# Patient Record
Sex: Male | Born: 1935 | Race: White | Hispanic: No | Marital: Married | Smoking: Former smoker
Health system: Southern US, Community
[De-identification: ages and names within clinical notes are randomized; demographics above are authoritative.]

## PROBLEM LIST (undated history)

## (undated) DIAGNOSIS — I509 Heart failure, unspecified: Secondary | ICD-10-CM

## (undated) DIAGNOSIS — C801 Malignant (primary) neoplasm, unspecified: Secondary | ICD-10-CM

## (undated) DIAGNOSIS — C787 Secondary malignant neoplasm of liver and intrahepatic bile duct: Secondary | ICD-10-CM

## (undated) HISTORY — PX: PERMANENT PACEMAKER INSERTION: SHX6023

## (undated) HISTORY — DX: Heart failure, unspecified: I50.9

## (undated) HISTORY — PX: VASECTOMY: SHX75

---

## 1959-12-31 HISTORY — PX: URETHROPLASTY: SHX499

## 2003-12-31 HISTORY — PX: COLON SURGERY: SHX602

## 2007-04-06 ENCOUNTER — Encounter: Admission: RE | Admit: 2007-04-06 | Discharge: 2007-04-06 | Payer: Self-pay | Admitting: Internal Medicine

## 2007-07-29 ENCOUNTER — Inpatient Hospital Stay (HOSPITAL_COMMUNITY): Admission: EM | Admit: 2007-07-29 | Discharge: 2007-07-30 | Payer: Self-pay | Admitting: Emergency Medicine

## 2007-08-02 ENCOUNTER — Emergency Department (HOSPITAL_COMMUNITY): Admission: EM | Admit: 2007-08-02 | Discharge: 2007-08-02 | Payer: Self-pay | Admitting: Emergency Medicine

## 2007-08-06 ENCOUNTER — Emergency Department (HOSPITAL_COMMUNITY): Admission: EM | Admit: 2007-08-06 | Discharge: 2007-08-06 | Payer: Self-pay | Admitting: Emergency Medicine

## 2007-12-08 ENCOUNTER — Ambulatory Visit (HOSPITAL_COMMUNITY): Admission: RE | Admit: 2007-12-08 | Discharge: 2007-12-08 | Payer: Self-pay | Admitting: Urology

## 2008-02-26 ENCOUNTER — Ambulatory Visit: Payer: Self-pay | Admitting: Vascular Surgery

## 2008-12-20 ENCOUNTER — Ambulatory Visit: Payer: Self-pay | Admitting: Vascular Surgery

## 2009-01-05 ENCOUNTER — Ambulatory Visit: Payer: Self-pay | Admitting: Vascular Surgery

## 2009-01-06 ENCOUNTER — Ambulatory Visit: Payer: Self-pay | Admitting: Vascular Surgery

## 2010-01-26 ENCOUNTER — Ambulatory Visit: Payer: Self-pay | Admitting: Vascular Surgery

## 2010-03-04 ENCOUNTER — Inpatient Hospital Stay: Payer: Self-pay | Admitting: Specialist

## 2010-03-26 ENCOUNTER — Ambulatory Visit (HOSPITAL_COMMUNITY): Admission: RE | Admit: 2010-03-26 | Discharge: 2010-03-26 | Payer: Self-pay | Admitting: Internal Medicine

## 2010-12-30 HISTORY — PX: SCALP LACERATION REPAIR: SHX6089

## 2011-01-29 ENCOUNTER — Ambulatory Visit: Admit: 2011-01-29 | Payer: Self-pay | Admitting: Vascular Surgery

## 2011-05-14 NOTE — Procedures (Signed)
DUPLEX ULTRASOUND OF ABDOMINAL AORTA   INDICATION:  Followup abdominal aortic aneurysm.   HISTORY:  Diabetes:  No.  Cardiac:  Arrhythmia and pacemaker in May of 2003.  Hypertension:  Yes.  Smoking:  Quit.  Connective Tissue Disorder:  Family History:  Yes.  Previous Surgery:   DUPLEX EXAM:         AP (cm)                   TRANSVERSE (cm)  Proximal             3.24 cm                   3.56 cm  Mid                  4.91 cm                   4.75 cm  Distal               4.14 cm                   4.43 cm  Right Iliac          1.53 cm                   1.32 cm  Left Iliac           1.47 cm                   1.36 cm   PREVIOUS:  Date:  AP:  4.52  TRANSVERSE:  4.83   IMPRESSION:  Abdominal aortic aneurysm noted with the largest  measurement of 4.91 cm x 4.75 cm.   ___________________________________________  Larina Earthly, M.D.   MG/MEDQ  D:  01/05/2009  T:  01/05/2009  Job:  [id number]

## 2011-05-14 NOTE — Procedures (Signed)
CAROTID DUPLEX EXAM   INDICATION:  Left carotid bruit.   HISTORY:  Diabetes:  No.  Cardiac:  Pacemaker.  Hypertension:  Yes.  Smoking:  Quit.  Previous Surgery:  None.  CV History:  Amaurosis Fugax No, Paresthesias No, Hemiparesis No.                                       RIGHT             LEFT  Brachial systolic pressure:  Brachial Doppler waveforms:  Vertebral direction of flow:        Antegrade         Antegrade  DUPLEX VELOCITIES (cm/sec)  CCA peak systolic                   87                58  ECA peak systolic                   126               81  ICA peak systolic                   186               Occluded  ICA end diastolic                   72                Occluded  PLAQUE MORPHOLOGY:                  Heterogenous      Heterogenous  PLAQUE AMOUNT:                      Moderate          Severe  PLAQUE LOCATION:                    ICA, ECA          ICA, ECA   IMPRESSION:  1. Occluded left internal carotid artery.  2. 40-59% stenosis noted in the right internal carotid artery.  3. Antegrade bilateral vertebral arteries.   ___________________________________________  Larina Earthly, M.D.   MG/MEDQ  D:  12/20/2008  T:  12/20/2008  Job:  [id number]

## 2011-05-14 NOTE — Assessment & Plan Note (Signed)
OFFICE VISIT   David Miles, David Miles  DOB:  01-12-1934                                       01/26/2010  [id number]#:[id number]   The patient presents today for continued follow-up of his infrarenal  abdominal aortic aneurysm.  He is here for 1-year follow-up.  He was  scheduled to have an ultrasound today, but had a recent CT scan for  evaluation of renal stone at [facility].  I do have his CT scan  for evaluation and have reviewed this and discussed it with the patient.  He has no symptoms referable to his aneurysm.  He does have multiple  difficulties mainly related to urethral stricture.  He had urethral  stricture replacement 40 years ago and has had to self-cath occasionally  and is having ongoing evaluation for this.  He continues to be a  nonsmoker.  He has no cardiac difficulties aside from arrhythmia with  pacemaker placed in 2003.   REVIEW OF SYSTEMS:  Is negative for pulmonary or GI difficulty.  He  denies any new neurologic deficits.   PHYSICAL EXAM:  Well-developed, well-nourished white male appearing  stated age of 45, he is in no acute distress.  Blood pressure is 146/81,  pulse 69, respirations 18, his temperature is 97.9.  HEENT is normal.  Chest is clear bilaterally.  Abdominal exam is soft, nontender.  No  masses.  Does have a lower midline incision.  He has 2+ femoral,  popliteal and posterior tibial pulses.  He has no ulcers or rashes on  his lower extremities and no focal deficits and no focal weakness from  neurologic standpoint.   I reviewed his CAT scan and this shows maximum diameter of 5 cm which is  up slightly from his ultrasound 1 year ago.  I have repeated that we see  him again in 1 year with repeat ultrasound at that time.  I discussed  symptoms of leaking aneurysm and he knows to report immediately should  this occur, otherwise we will see him again in 1 year.     Larina Earthly, M.D.  Electronically Signed   TFE/MEDQ  D:  01/26/2010  T:  01/29/2010  Job:  [id number]   cc:   Kari Baars, M.D.
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
## 2011-05-14 NOTE — Procedures (Signed)
DUPLEX ULTRASOUND OF ABDOMINAL AORTA   INDICATION:  Follow up abdominal aortic aneurysm.   HISTORY:  Diabetes:  No.  Cardiac:  Arrhythmia, defibrillator, pacemaker implant May 2003.  Hypertension:  Yes.  Smoking:  Quit.  Connective Tissue Disorder:  Family History:  AAA father and uncle, aunt died of AAA rupture.  Previous Surgery:  No.   DUPLEX EXAM:         AP (cm)                   TRANSVERSE (cm)  Proximal             2.88 cm                   2.95 cm  Mid                  4.52 cm                   4.83 cm  Distal               3.27 cm                   3.20 cm  Right Iliac          1.09 cm  Left Iliac           1.25 cm   PREVIOUS CT :  Date: 07/29/2007  AP:  4.9  TRANSVERSE:  4.5  Previous ultrasound:  08/11/2006  AP:  3.8  TRANSVERSE:  4.4   IMPRESSION:  Bilobular abdominal aortic aneurysm in mid/distal aorta  shows an increase from previous ultrasound study and correlates with  previous CT.   ___________________________________________  Larina Earthly, M.D.   AS/MEDQ  D:  02/26/2008  T:  02/27/2008  Job:  [id number]

## 2011-05-14 NOTE — Op Note (Signed)
NAMEKORBIN, MAPPS NO.:  [id number]   MEDICAL RECORD NO.:  [id number]          PATIENT TYPE:  AMB   LOCATION:  DAY                          FACILITY:  [facility]   PHYSICIAN:  Martina Sinner, MD DATE OF BIRTH:  17-Apr-1934   DATE OF PROCEDURE:  12/08/2007  DATE OF DISCHARGE:                               OPERATIVE REPORT   PREOPERATIVE DIAGNOSIS:  Panurethral stricture disease.   POSTOPERATIVE DIAGNOSIS:  Panurethral stricture disease.   OPERATION PERFORMED:  1. Cystoscopy.  2. Balloon dilation of urethral stricture.   SURGEON:  Leighton Roach McDiarmid, M.D.   ANESTHESIA:   INDICATIONS:  The patient has full length urethral stricture disease and  performs self-catheterization.  He is having to catheterize  approximately every three days for slowing of his stream.   DESCRIPTION OF THE OPERATION:  The patient was prepped and draped in the  usual fashion.  We used the fluoroscopy table, but it was not very  satisfactory for seeing the urethra.  I know this patient very well.  I  decided to pass a Sensor wire using open-ended ureteral catheter  technique into the bladder instead of doing a retrograde urethrogram. I  initially tried to cystoscope him with a 36 [place] scope, but could only  enter approximately 2 cm at which point he had approximately a 10 or 12  French strictured estimated appearance.   Once the Sensor wire was in good position I passed the [place] balloon  dilation catheter and used its markers to estimate where the external  sphincter was.  I balloon dilated for five minutes and removed the  dilation catheter after emptying the balloon.  I then recystoscoped with  a 17 [place] scope.  There was excellent dilation that was  circumferentially even throughout the urethra.  He has full length  panurethral stricture disease starting 1 cm distal to the urethral  sphincter.  The prostatic urethra demonstrated bilobar enlargement of  the prostate, a Grade  1-2/4 bladder trabeculation, but no bladder  stones.  There was no obvious perforation. There was little-to-no  bleeding.   It surprised me that I could not place an 18 French red rubber [place]  catheter over the Sensor wire after removing the scope since his urethra  was so rigid, is firmly fixed, and it would no accept an 18.  I was able  to pass a 16 [place] tip catheter and instilled approximately 7  mL in the balloon.   My plan is to leave the catheter in for three days just to make certain  he does not get extravasation and a soft tissue infection.  The patient  will remove the catheter on Friday and then start catheterizing himself  again next week as per protocol.           ______________________________  Martina Sinner, MD  Electronically Signed     SAM/MEDQ  D:  12/08/2007  T:  12/09/2007  Job:  [id number]
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
## 2011-05-14 NOTE — Assessment & Plan Note (Signed)
OFFICE VISIT   David, Miles  DOB:  02/18/1934                                       01/06/2009  EAVWU#:[id number]   The patient presents today for follow-up of his known abdominal aortic  aneurysm and also for discussion of a new finding of left internal  carotid artery occlusion.  He has no prior symptoms of stroke, amaurosis  fugax or transient ischemic attack.  He was found to have a bruit by Dr.  Eric Form and was sent to our office for noninvasive carotid duplex  evaluation.  This revealed occlusion of his left internal carotid artery  and moderate 40-59% stenosis of his right internal carotid artery.  He  also has a known abdominal aortic aneurysm and we have been following  this with serial ultrasounds.  He has no new changes in his medical  issues, he does have hypertension, does have history of congestive  failure, did have an internal pacer and defibrillator placed 6 years  ago.  He does not have a history of premature atherosclerotic disease in  his family.   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  He does not smoke, having quit 7 years ago and has a glass of wine  daily.   REVIEW OF SYSTEMS:  Weight is 235 pounds, 6 feet 1 inch tall, he does  have a history of atrial fibrillation, urethral stricture which he does  self-cath, skin for psoriasis; otherwise, pulmonary, GI negative.   He has no known medication allergies and his medication list is  attached.   PHYSICAL EXAMINATION:  General:  A well-developed well-nourished white  male appearing his stated age of 69.  Vital Signs:  His blood pressure  is 175/99, pulse 68, respirations 18.  I do not appreciate carotid  bruits.  He has 2+ carotid pulses bilaterally.  His radial, femoral,  popliteal and posterior tibial pulses are 2+ bilaterally.  Abdominal  Exam:  Reveals prominent aortic pulsation without pain.   He underwent ultrasound yesterday revealing  no significant change, his  maximal diameter was 4.9 cm of his left renal aneurysm, the previous  finding was 4.8 cm in February 2009.  I did review his carotid duplex  with him as well which shows an occluded internal carotid artery.  He  does have lower end of the moderate stenosis in the right carotid  system.  He his left-handed.  I discussed this at length with Mr.  Miles and his wife present.  I explained that I would not recommend  any further evaluation with his asymptomatic occlusion.  I explained as  long as he does have adequate flow in the right carotid that this should  be a non-issue.  We will continue to follow him on a yearly basis with  carotid duplex and ultrasound of his aneurysm.  He will notify us should  he develop any symptoms of aneurysm disease, which we have discussed  with him, and also if he should develop any neurologic deficits.   Larina Earthly, M.D.  Electronically Signed   TFE/MEDQ  D:  01/06/2009  T:  01/09/2009  Job:  [id number]   cc:   Kari Baars, M.D.
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
## 2011-08-03 ENCOUNTER — Emergency Department (HOSPITAL_COMMUNITY)
Admission: EM | Admit: 2011-08-03 | Discharge: 2011-08-03 | Disposition: A | Discharge status: Other Acute Inpatient Hospital | Payer: Medicare Other | Attending: Emergency Medicine | Admitting: Emergency Medicine

## 2011-08-03 ENCOUNTER — Emergency Department (HOSPITAL_COMMUNITY): Payer: Medicare Other

## 2011-08-03 DIAGNOSIS — R42 Dizziness and giddiness: Secondary | ICD-10-CM | POA: Insufficient documentation

## 2011-08-03 DIAGNOSIS — Z95810 Presence of automatic (implantable) cardiac defibrillator: Secondary | ICD-10-CM | POA: Insufficient documentation

## 2011-08-03 DIAGNOSIS — Z79899 Other long term (current) drug therapy: Secondary | ICD-10-CM | POA: Insufficient documentation

## 2011-08-03 DIAGNOSIS — I4729 Other ventricular tachycardia: Secondary | ICD-10-CM | POA: Insufficient documentation

## 2011-08-03 DIAGNOSIS — I10 Essential (primary) hypertension: Secondary | ICD-10-CM | POA: Insufficient documentation

## 2011-08-03 DIAGNOSIS — I509 Heart failure, unspecified: Secondary | ICD-10-CM | POA: Insufficient documentation

## 2011-08-03 DIAGNOSIS — Y831 Surgical operation with implant of artificial internal device as the cause of abnormal reaction of the patient, or of later complication, without mention of misadventure at the time of the procedure: Secondary | ICD-10-CM | POA: Insufficient documentation

## 2011-08-03 DIAGNOSIS — I472 Ventricular tachycardia, unspecified: Secondary | ICD-10-CM | POA: Insufficient documentation

## 2011-08-03 DIAGNOSIS — T82897A Other specified complication of cardiac prosthetic devices, implants and grafts, initial encounter: Secondary | ICD-10-CM | POA: Insufficient documentation

## 2011-08-03 LAB — CBC
MCH: 29.8 pg (ref 26.0–34.0)
MCHC: 34.1 g/dL (ref 30.0–36.0)
MCV: 87.3 fL (ref 78.0–100.0)
Platelets: 152 10*3/uL (ref 150–400)

## 2011-08-03 LAB — DIFFERENTIAL
Basophils Relative: 0 % (ref 0–1)
Eosinophils Absolute: 0.1 10*3/uL (ref 0.0–0.7)
Lymphs Abs: 2.1 10*3/uL (ref 0.7–4.0)
Monocytes Absolute: 0.6 10*3/uL (ref 0.1–1.0)
Monocytes Relative: 8 % (ref 3–12)

## 2011-08-03 LAB — POCT I-STAT, CHEM 8
BUN: 30 mg/dL — ABNORMAL HIGH (ref 6–23)
Calcium, Ion: 1.17 mmol/L (ref 1.12–1.32)
Chloride: 108 mEq/L (ref 96–112)
Creatinine, Ser: 1.7 mg/dL — ABNORMAL HIGH (ref 0.50–1.35)
Glucose, Bld: 97 mg/dL (ref 70–99)
TCO2: 20 mmol/L (ref 0–100)

## 2011-08-03 LAB — MAGNESIUM: Magnesium: 2.2 mg/dL (ref 1.5–2.5)

## 2011-09-30 HISTORY — PX: ABDOMINAL AORTIC ANEURYSM REPAIR: SUR1152

## 2011-10-07 LAB — BASIC METABOLIC PANEL
BUN: 14
Chloride: 107
Creatinine, Ser: 1.28
GFR calc non Af Amer: 55 — ABNORMAL LOW
Glucose, Bld: 95
Potassium: 4.6

## 2011-10-14 LAB — CBC
HCT: 36.9 — ABNORMAL LOW
MCHC: 33.8
MCV: 89.3
RBC: 4.13 — ABNORMAL LOW
WBC: 10.5

## 2011-10-14 LAB — PROTIME-INR
INR: 1.1
Prothrombin Time: 14.3

## 2011-10-14 LAB — COMPREHENSIVE METABOLIC PANEL
CO2: 26
Calcium: 9
Creatinine, Ser: 1.23
GFR calc non Af Amer: 58 — ABNORMAL LOW
Glucose, Bld: 131 — ABNORMAL HIGH
Sodium: 140
Total Protein: 6.3

## 2011-10-14 LAB — DIFFERENTIAL
Basophils Absolute: 0
Eosinophils Relative: 1
Lymphocytes Relative: 10 — ABNORMAL LOW
Lymphs Abs: 1.1
Neutro Abs: 8.6 — ABNORMAL HIGH
Neutrophils Relative %: 82 — ABNORMAL HIGH

## 2011-10-14 LAB — APTT: aPTT: 29

## 2011-10-14 LAB — HEMOGLOBIN: Hemoglobin: 10.9 — ABNORMAL LOW

## 2012-12-16 ENCOUNTER — Encounter: Payer: Self-pay | Admitting: Vascular Surgery

## 2013-02-11 ENCOUNTER — Ambulatory Visit: Payer: Medicare Other | Admitting: Neurosurgery

## 2014-01-03 ENCOUNTER — Ambulatory Visit (INDEPENDENT_AMBULATORY_CARE_PROVIDER_SITE_OTHER): Payer: Medicare Other | Admitting: Emergency Medicine

## 2014-01-03 ENCOUNTER — Ambulatory Visit: Payer: Medicare Other

## 2014-01-03 VITALS — BP 190/94 | HR 62 | Temp 98.0°F | Resp 16 | Ht 71.5 in | Wt 229.8 lb

## 2014-01-03 DIAGNOSIS — W19XXXA Unspecified fall, initial encounter: Secondary | ICD-10-CM

## 2014-01-03 DIAGNOSIS — R0781 Pleurodynia: Secondary | ICD-10-CM

## 2014-01-03 DIAGNOSIS — N3090 Cystitis, unspecified without hematuria: Secondary | ICD-10-CM

## 2014-01-03 LAB — POCT URINALYSIS DIPSTICK
Bilirubin, UA: NEGATIVE
Glucose, UA: NEGATIVE
Ketones, UA: NEGATIVE
NITRITE UA: POSITIVE
PH UA: 7
Protein, UA: NEGATIVE
SPEC GRAV UA: 1.02
UROBILINOGEN UA: 0.2

## 2014-01-03 LAB — POCT UA - MICROSCOPIC ONLY
CASTS, UR, LPF, POC: NEGATIVE
Crystals, Ur, HPF, POC: NEGATIVE
MUCUS UA: POSITIVE
YEAST UA: NEGATIVE

## 2014-01-03 MED ORDER — ACETAMINOPHEN-CODEINE #3 300-30 MG PO TABS: 1.0000 | ORAL_TABLET | ORAL | Status: DC | PRN

## 2014-01-03 NOTE — Progress Notes (Signed)
[facility] [address], [place] [address] [phone]  Date:  01/03/2014   Name:  David Miles   DOB:  02/01/1934   MRN:  [id number]  PCP:  No primary provider on file.    Chief Complaint: Fall   History of Present Illness:  David Miles is a 80 y.o. very pleasant male patient who presents with the following:  [place] three weeks ago when he became dizzy and fell backwards and injured his right side on the toilet lid.  He has experienced pleuritic pain since.  No hemoptysis or shortness of breath.  No nausea or vomiting.  No hematuria.  No abdominal pain. No improvement with over the counter medications or other home remedies. Denies other complaint or health concern today.   There are no active problems to display for this patient.   Past Medical History  Diagnosis Date  . CHF (congestive heart failure)     Past Surgical History  Procedure Laterality Date  . Colon surgery      History  Substance Use Topics  . Smoking status: Never Smoker   . Smokeless tobacco: Not on file  . Alcohol Use: Yes    No family history on file.  No Known Allergies  Medication list has been reviewed and updated.  No current outpatient prescriptions on file prior to visit.   No current facility-administered medications on file prior to visit.    Review of Systems:  As per HPI, otherwise negative.    Physical Examination: Filed Vitals:   01/03/14 1120  BP: 190/94  Pulse: 62  Temp: 98 F (36.7 C)  Resp: 16   Filed Vitals:   01/03/14 1120  Height: 5' 11.5" (1.816 m)  Weight: 229 lb 12.8 oz (104.237 kg)   Body mass index is 31.61 kg/(m^2). Ideal Body Weight: Weight in (lb) to have BMI = 25: 181.4  GEN: WDWN, NAD, Non-toxic, A & O x 3 HEENT: Atraumatic, Normocephalic. Neck supple. No masses, No LAD. Ears and Nose: No external deformity. CV: RRR, No M/G/R. No JVD. No thrill. No extra heart sounds. PULM: CTA B, no wheezes, crackles,  rhonchi. No retractions. No resp. distress. No accessory muscle use. ABD: S, NT, ND, +BS. No rebound. No HSM. EXTR: No c/c/e NEURO Normal gait.  PSYCH: Normally interactive. Conversant. Not depressed or anxious appearing.  Calm demeanor.    Assessment and Plan: Contusion chest wall Cystitis Urethral stricture self cath's Take cipro Tylenol #3   Signed,  Ellison Carwin, MD   [facility] reading (PRIMARY) by  Dr. Ouida Sills.  Negative chest Results for orders placed in visit on 01/03/14  POCT UA - MICROSCOPIC ONLY      Result Value Range   WBC, Ur, HPF, POC TNTC     RBC, urine, microscopic 1-3     Bacteria, U Microscopic 3+     Mucus, UA positive     Epithelial cells, urine per micros 3-4     Crystals, Ur, HPF, POC neg     Casts, Ur, LPF, POC neg     Yeast, UA neg    POCT URINALYSIS DIPSTICK      Result Value Range   Color, UA yellow     Clarity, UA turbid     Glucose, UA neg     Bilirubin, UA neg     Ketones, UA neg     Spec Grav, UA 1.020     Blood, UA trace-intact  pH, UA 7.0     Protein, UA neg     Urobilinogen, UA 0.2     Nitrite, UA positive     Leukocytes, UA large (3+)      .

## 2014-01-03 NOTE — Patient Instructions (Signed)
Chest Contusion °A chest contusion is a deep bruise on your chest area. Contusions are the result of an injury that caused bleeding under the skin. A chest contusion may involve bruising of the skin, muscles, or ribs. The contusion may turn blue, purple, or yellow. Minor injuries will give you a painless contusion, but more severe contusions may stay painful and swollen for a few weeks. °CAUSES  °A contusion is usually caused by a blow, trauma, or direct force to an area of the body. °SYMPTOMS  °· Swelling and redness of the injured area. °· Discoloration of the injured area. °· Tenderness and soreness of the injured area. °· Pain. °DIAGNOSIS  °The diagnosis can be made by taking a history and performing a physical exam. An X-ray, CT scan, or MRI may be needed to determine if there were any associated injuries, such as broken bones (fractures) or internal injuries. °TREATMENT  °Often, the best treatment for a chest contusion is resting, icing, and applying cold compresses to the injured area. Deep breathing exercises may be recommended to reduce the risk of pneumonia. Over-the-counter medicines may also be recommended for pain control. °HOME CARE INSTRUCTIONS  °· Put ice on the injured area. °· Put ice in a plastic bag. °· Place a towel between your skin and the bag. °· Leave the ice on for 15-20 minutes, 03-04 times a day. °· Only take over-the-counter or prescription medicines as directed by your caregiver. Your caregiver may recommend avoiding anti-inflammatory medicines (aspirin, ibuprofen, and naproxen) for 48 hours because these medicines may increase bruising. °· Rest the injured area. °· Perform deep-breathing exercises as directed by your caregiver. °· Stop smoking if you smoke. °· Do not lift objects over 5 pounds (2.3 kg) for 3 days or longer if recommended by your caregiver. °SEEK IMMEDIATE MEDICAL CARE IF:  °· You have increased bruising or swelling. °· You have pain that is getting worse. °· You have  difficulty breathing. °· You have dizziness, weakness, or fainting. °· You have blood in your urine or stool. °· You cough up or vomit blood. °· Your swelling or pain is not relieved with medicines. °MAKE SURE YOU:  °· Understand these instructions. °· Will watch your condition. °· Will get help right away if you are not doing well or get worse. °Document Released: 09/10/2001 Document Revised: 09/09/2012 Document Reviewed: 06/08/2012 °ExitCare® Patient Information ©2014 [employer]. ° °

## 2014-01-17 ENCOUNTER — Telehealth: Payer: Self-pay | Admitting: Hematology and Oncology

## 2014-01-17 ENCOUNTER — Telehealth: Payer: Self-pay | Admitting: Oncology

## 2014-01-17 NOTE — Telephone Encounter (Signed)
lvom for pt to return call in re to referral.  °

## 2014-01-17 NOTE — Telephone Encounter (Signed)
S/w pt and gve np appt 01/27 @ 1:30 w/Dr. Alen Blew Referring Dr. Matilde Sprang Dx- Back pain [place] unknown source Welcome packet mailed.

## 2014-01-17 NOTE — Telephone Encounter (Signed)
C/D 01/17/14 for appt. 01/25/14

## 2014-01-24 ENCOUNTER — Ambulatory Visit (HOSPITAL_BASED_OUTPATIENT_CLINIC_OR_DEPARTMENT_OTHER): Payer: Medicare Other

## 2014-01-24 ENCOUNTER — Encounter: Payer: Self-pay | Admitting: Oncology

## 2014-01-24 ENCOUNTER — Ambulatory Visit (HOSPITAL_BASED_OUTPATIENT_CLINIC_OR_DEPARTMENT_OTHER): Payer: Medicare Other | Admitting: Oncology

## 2014-01-24 ENCOUNTER — Other Ambulatory Visit (HOSPITAL_BASED_OUTPATIENT_CLINIC_OR_DEPARTMENT_OTHER): Payer: Medicare Other

## 2014-01-24 ENCOUNTER — Telehealth: Payer: Self-pay | Admitting: Oncology

## 2014-01-24 ENCOUNTER — Other Ambulatory Visit: Payer: Self-pay | Admitting: Oncology

## 2014-01-24 ENCOUNTER — Ambulatory Visit: Payer: Medicare Other

## 2014-01-24 VITALS — BP 172/77 | HR 71 | Temp 98.0°F | Resp 20 | Ht 71.5 in | Wt 225.1 lb

## 2014-01-24 DIAGNOSIS — M949 Disorder of cartilage, unspecified: Secondary | ICD-10-CM

## 2014-01-24 DIAGNOSIS — C229 Malignant neoplasm of liver, not specified as primary or secondary: Secondary | ICD-10-CM

## 2014-01-24 DIAGNOSIS — C7952 Secondary malignant neoplasm of bone marrow: Secondary | ICD-10-CM

## 2014-01-24 DIAGNOSIS — K7689 Other specified diseases of liver: Secondary | ICD-10-CM

## 2014-01-24 DIAGNOSIS — C801 Malignant (primary) neoplasm, unspecified: Secondary | ICD-10-CM

## 2014-01-24 DIAGNOSIS — C419 Malignant neoplasm of bone and articular cartilage, unspecified: Secondary | ICD-10-CM

## 2014-01-24 DIAGNOSIS — C7951 Secondary malignant neoplasm of bone: Secondary | ICD-10-CM

## 2014-01-24 DIAGNOSIS — M899 Disorder of bone, unspecified: Secondary | ICD-10-CM

## 2014-01-24 DIAGNOSIS — F17200 Nicotine dependence, unspecified, uncomplicated: Secondary | ICD-10-CM

## 2014-01-24 LAB — COMPREHENSIVE METABOLIC PANEL (CC13)
ALT: 18 U/L (ref 0–55)
ANION GAP: 9 meq/L (ref 3–11)
AST: 20 U/L (ref 5–34)
Albumin: 3.1 g/dL — ABNORMAL LOW (ref 3.5–5.0)
Alkaline Phosphatase: 105 U/L (ref 40–150)
BUN: 45.4 mg/dL — ABNORMAL HIGH (ref 7.0–26.0)
CALCIUM: 13.7 mg/dL — AB (ref 8.4–10.4)
CHLORIDE: 103 meq/L (ref 98–109)
CO2: 29 meq/L (ref 22–29)
Creatinine: 2.5 mg/dL — ABNORMAL HIGH (ref 0.7–1.3)
Glucose: 135 mg/dl (ref 70–140)
Potassium: 4.7 mEq/L (ref 3.5–5.1)
Sodium: 141 mEq/L (ref 136–145)
Total Bilirubin: 0.5 mg/dL (ref 0.20–1.20)
Total Protein: 7.5 g/dL (ref 6.4–8.3)

## 2014-01-24 LAB — CBC WITH DIFFERENTIAL/PLATELET
BASO%: 0.3 % (ref 0.0–2.0)
Basophils Absolute: 0 10*3/uL (ref 0.0–0.1)
EOS%: 1.3 % (ref 0.0–7.0)
Eosinophils Absolute: 0.2 10*3/uL (ref 0.0–0.5)
HCT: 32.1 % — ABNORMAL LOW (ref 38.4–49.9)
HGB: 10.6 g/dL — ABNORMAL LOW (ref 13.0–17.1)
LYMPH#: 1.3 10*3/uL (ref 0.9–3.3)
LYMPH%: 11.4 % — ABNORMAL LOW (ref 14.0–49.0)
MCH: 28.1 pg (ref 27.2–33.4)
MCHC: 33 g/dL (ref 32.0–36.0)
MCV: 85.1 fL (ref 79.3–98.0)
MONO#: 0.9 10*3/uL (ref 0.1–0.9)
MONO%: 8.2 % (ref 0.0–14.0)
NEUT#: 9 10*3/uL — ABNORMAL HIGH (ref 1.5–6.5)
NEUT%: 78.8 % — ABNORMAL HIGH (ref 39.0–75.0)
Platelets: 226 10*3/uL (ref 140–400)
RBC: 3.77 10*6/uL — AB (ref 4.20–5.82)
RDW: 13.8 % (ref 11.0–14.6)
WBC: 11.5 10*3/uL — ABNORMAL HIGH (ref 4.0–10.3)

## 2014-01-24 MED ORDER — ZOLEDRONIC ACID 4 MG/100ML IV SOLN
4.0000 mg | Freq: Once | INTRAVENOUS | Status: AC
  Administered 2014-01-24: 4 mg via INTRAVENOUS
  Filled 2014-01-24: qty 100

## 2014-01-24 MED ORDER — SODIUM CHLORIDE 0.9 % IV SOLN
Freq: Once | INTRAVENOUS | Status: AC
  Administered 2014-01-24: 16:00:00 via INTRAVENOUS

## 2014-01-24 MED ORDER — OXYCODONE HCL 5 MG PO TABS: 5.0000 mg | ORAL_TABLET | ORAL | Status: DC | PRN

## 2014-01-24 NOTE — Telephone Encounter (Signed)
gv and printed appt sched and avs for pt for Feb  °

## 2014-01-24 NOTE — Progress Notes (Signed)
Note dictated

## 2014-01-24 NOTE — Patient Instructions (Signed)

## 2014-01-24 NOTE — Progress Notes (Signed)
Checked in new patient with no financial issues. He has appt card. °

## 2014-01-24 NOTE — Telephone Encounter (Signed)
S/w pt and gve new d/t for appt 01/26 @ 1:30 w/Dr. Alen Blew.  Patient confirm appt.

## 2014-01-25 ENCOUNTER — Other Ambulatory Visit: Payer: Self-pay | Admitting: Oncology

## 2014-01-25 ENCOUNTER — Ambulatory Visit: Payer: Medicare Other | Admitting: Oncology

## 2014-01-25 ENCOUNTER — Other Ambulatory Visit: Payer: Self-pay | Admitting: Radiology

## 2014-01-25 ENCOUNTER — Other Ambulatory Visit: Payer: Medicare Other

## 2014-01-25 ENCOUNTER — Ambulatory Visit: Payer: Medicare Other

## 2014-01-25 DIAGNOSIS — C229 Malignant neoplasm of liver, not specified as primary or secondary: Secondary | ICD-10-CM

## 2014-01-25 DIAGNOSIS — C419 Malignant neoplasm of bone and articular cartilage, unspecified: Secondary | ICD-10-CM

## 2014-01-25 NOTE — Progress Notes (Signed)
CC:   [name], M.D. Reece Packer, MD  REASON FOR CONSULTATION:  Liver and bone lesions.  HISTORY OF PRESENT ILLNESS:  David Miles is a pleasant 80 year old gentleman, native of [place], currently resides in [place]. He is a gentleman with past medical history significant for coronary artery disease and atrial fibrillation, has a pacemaker in place.  He also has history of urethral strictures as well as nephrolithiasis.  For the last two months, he has noticed a flank pain and back pain after he had a fall episode in the bathroom.  He was evaluated by Dr. Matilde Sprang, and underwent a CT scan of the abdomen and pelvis which was designed for kidney stone protocol and showed a lytic lesion in the T12 vertebral body that is highly suspicious for metastatic disease.  The lytic lesion measuring about 3.2 cm.  There was also a noted multiple low-density lesions in the liver, right hepatic lobe measuring 2.6, and the largest lesion in the left hepatic lobe measuring 3.7.  Based on these findings, the patient was referred to me for an evaluation.  The patient had reported in the last two months, episodes of weight loss.  He also had episodes of confusion as well as deterioration in his cognitive and mentation.  He also had noticed decrease in his p.o. intake.  He had noted also some episodes of hematochezia, but has not been rather persistent.  He does report back pain and his back pain is reported as sharp in the middle of his back, not radiating and is not associated with any focal neurological problems.  He does report overall unsteady gait, but does not report any motor or sensory deficits.  REVIEW OF SYSTEMS:  He did not report any headaches, blurry vision, double vision.  Does report confusions, but no reporting any seizure activity.  Does report some mental status changes and lethargy at times. He does report back pain which he takes hydrocodone for, but that  seems to help some but not dramatically.  He does not report any other musculoskeletal pain such as hip pain or shoulder pain or arm pain.  He does not report any chest pain, shortness of breath.  Does report some occasional exertional dyspnea and difficulty breathing.  He does not report any palpitations.  He does not report any leg edema.  He does not report any orthopnea.  He does not report any cough.  He does not report any hemoptysis.  He does not report any wheezing.  He does not report any nausea, vomiting, abdominal pain, constipation, or diarrhea.  He does report some occasional hematochezia, but no melena.  He does report difficulty with urination as to self-catheterize.  He does not report any dysuria.  He does not report any hematuria.  He does not report any frequency, urgency, or hesitancy.  He does not report any bleeding or clotting tendencies.  He does not report any lymphadenopathy or petechiae.  He does not report any heat or cold intolerance.  The rest of the review of system was unremarkable.  PAST MEDICAL HISTORY:  Significant for atrial fibrillation, history of coronary artery disease, history of hypertension, history of ureteral stricture, history of pacemaker placement, partial colectomy for diverticulitis, history of abdominal aortic aneurysm.  MEDICATIONS:  Aspirin, B complex, Cipro, lisinopril, metoprolol, niacin, omega-3, and Tylenol as well as hydrocodone.  ALLERGIES:  None.  FAMILY HISTORY:  Not significant for any specific malignancy.  He is a former smoker, but quit a while  ago.  He is married.  He has two children.  He has had multiple occupations.  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  He also served in [employer] for four years.  PHYSICAL EXAMINATION:  General:  Alert, awake gentleman, appeared in some mild distress.  Vital Signs:  His blood pressure is 172/77, pulse 71, respirations 20, weighs 255 pounds.  He is down  four pounds since 01/03/2014.  His ECOG performance status is 1.  HEENT:  Head is normocephalic, atraumatic.  Pupils equal, round, and reactive to light. Oral mucosa moist and pink.  Neck:  Supple.  No lymphadenopathy.  Heart: Regular rate and rhythm.  S1, S2.  Lungs:  Clear to auscultation.  No rhonchi or wheeze, dullness to percussion.  Abdomen:  Soft, nontender. No hepatosplenomegaly.  Extremities:  No edema.  Neurologic: Did not have any deficits with motor and sensory and gait or deep tendon reflexes.  LABORATORY DATA:  Reviewed today, showed a hemoglobin of 10.6, white cell count of 11.5, platelet count of 226.  Chemistry showed a creatinine of 2.5, calcium of 13.  Normal liver function tests.  ASSESSMENT AND PLAN:  This is a new 80 year old gentleman with the following issues:  1. Liver lesion, suspicious for metastatic cancer as well as     involvement of T12 lesion.  The differential diagnosis was     discussed today with Mr. Shope and his family, and looks very     suspicious for advanced malignancy.  The primary tumor could be due     to or originating from the colon, prostate, GU tract, or other     lesions such as melanoma.  In order to work this up, he will need a     complete staging workup, as well as liver biopsy.  I have explained     to him that without tissue diagnosis, we will not be able to     identify the primary origin of site and that will help [place] tailor     the treatment option, but I explained to him that this is rather     serious diagnosis and the likelihood that we are not dealing with a     curable situation, but we will start with the staging and     confirming the diagnosis, then will proceed with that. 2. Bony metastasis as this is evident by his T12 lesion, which could     also be rather critical lesion that might require an intervention     or surgery or radiation, and we can certainly proceed from there     depending on the results of the  PET-CT scan.  He had developed     hypercalcemia as well, and I have discussed with him the risks and     benefits of treating him with Zometa as soon as possible,     preferably today to lower his calcium as this contributing to his     mental lethargy and confusion. 3. Bone pain.  He was given a prescription for oxycodone with     instructions how to take it as well as complications associated     with that. 4. Smoking cessation discussion.  He already had stopped smoking over     20 years ago.  Followup will be in the next couple of weeks once     these studies have been completed.    ______________________________ David Miles, M.D.  FNS/MEDQ  D:  01/24/2014  T:  01/25/2014  Job:  [id number]

## 2014-01-26 ENCOUNTER — Encounter (HOSPITAL_COMMUNITY): Payer: Self-pay | Admitting: Pharmacy Technician

## 2014-01-28 ENCOUNTER — Ambulatory Visit (HOSPITAL_COMMUNITY)
Admission: RE | Admit: 2014-01-28 | Discharge: 2014-01-28 | Disposition: A | Discharge status: Home / Self Care | Payer: Medicare Other | Source: Ambulatory Visit | Attending: Oncology | Admitting: Oncology

## 2014-01-28 ENCOUNTER — Ambulatory Visit (HOSPITAL_COMMUNITY): Admission: RE | Admit: 2014-01-28 | Payer: Medicare Other | Source: Ambulatory Visit

## 2014-01-28 ENCOUNTER — Encounter (HOSPITAL_COMMUNITY): Payer: Self-pay

## 2014-01-28 DIAGNOSIS — Z79899 Other long term (current) drug therapy: Secondary | ICD-10-CM | POA: Insufficient documentation

## 2014-01-28 DIAGNOSIS — C787 Secondary malignant neoplasm of liver and intrahepatic bile duct: Secondary | ICD-10-CM | POA: Diagnosis not present

## 2014-01-28 DIAGNOSIS — C419 Malignant neoplasm of bone and articular cartilage, unspecified: Secondary | ICD-10-CM

## 2014-01-28 DIAGNOSIS — I509 Heart failure, unspecified: Secondary | ICD-10-CM | POA: Insufficient documentation

## 2014-01-28 DIAGNOSIS — C801 Malignant (primary) neoplasm, unspecified: Secondary | ICD-10-CM | POA: Diagnosis not present

## 2014-01-28 DIAGNOSIS — C229 Malignant neoplasm of liver, not specified as primary or secondary: Secondary | ICD-10-CM

## 2014-01-28 DIAGNOSIS — K7689 Other specified diseases of liver: Secondary | ICD-10-CM | POA: Diagnosis present

## 2014-01-28 LAB — PROTIME-INR
INR: 1.17 (ref 0.00–1.49)
Prothrombin Time: 14.7 seconds (ref 11.6–15.2)

## 2014-01-28 LAB — CBC
HEMATOCRIT: 30.8 % — AB (ref 39.0–52.0)
Hemoglobin: 10.2 g/dL — ABNORMAL LOW (ref 13.0–17.0)
MCH: 28.3 pg (ref 26.0–34.0)
MCHC: 33.1 g/dL (ref 30.0–36.0)
MCV: 85.3 fL (ref 78.0–100.0)
Platelets: 212 10*3/uL (ref 150–400)
RBC: 3.61 MIL/uL — ABNORMAL LOW (ref 4.22–5.81)
RDW: 13.7 % (ref 11.5–15.5)
WBC: 11.3 10*3/uL — ABNORMAL HIGH (ref 4.0–10.5)

## 2014-01-28 LAB — APTT: APTT: 34 s (ref 24–37)

## 2014-01-28 MED ORDER — SODIUM CHLORIDE 0.9 % IV SOLN
INTRAVENOUS | Status: DC
  Administered 2014-01-28: 20 mL/h via INTRAVENOUS

## 2014-01-28 MED ORDER — FENTANYL CITRATE 0.05 MG/ML IJ SOLN
INTRAMUSCULAR | Status: AC | PRN
  Administered 2014-01-28: 50 ug via INTRAVENOUS

## 2014-01-28 MED ORDER — MIDAZOLAM HCL 2 MG/2ML IJ SOLN
INTRAMUSCULAR | Status: AC | PRN
  Administered 2014-01-28 (×3): 1 mg via INTRAVENOUS
  Administered 2014-01-28: 0.5 mg via INTRAVENOUS

## 2014-01-28 MED ORDER — MIDAZOLAM HCL 2 MG/2ML IJ SOLN
INTRAMUSCULAR | Status: AC
  Filled 2014-01-28: qty 6

## 2014-01-28 MED ORDER — FENTANYL CITRATE 0.05 MG/ML IJ SOLN
INTRAMUSCULAR | Status: AC
  Filled 2014-01-28: qty 6

## 2014-01-28 NOTE — Procedures (Signed)
Technically successful US guided biopsy of indeterminate lesion within the left lobe of the liver.  No immediate complications.

## 2014-01-28 NOTE — H&P (Signed)
David Miles is an 80 y.o. male.   Chief Complaint: "I'm having a liver biopsy" HPI: Patient with history of back pain , weight loss and recent imaging revealing T12 lytic lesion and multiple liver lesions presents today for US guided liver lesion biopsy.  Past Medical History  Diagnosis Date  . CHF (congestive heart failure)     Past Surgical History  Procedure Laterality Date  . Colon surgery      History reviewed. No pertinent family history. Social History:  reports that he has never smoked. He does not have any smokeless tobacco history on file. He reports that he drinks alcohol. He reports that he does not use illicit drugs.  Allergies: No Known Allergies  Current outpatient prescriptions:acetaminophen-codeine (TYLENOL #3) 300-30 MG per tablet, Take 1 tablet by mouth every 4 (four) hours as needed for moderate pain., Disp: , Rfl: ;  atorvastatin (LIPITOR) 20 MG tablet, Take 20 mg by mouth every other day., Disp: , Rfl: ;  KRILL OIL PO, Take 1 capsule by mouth daily., Disp: , Rfl: ;  lisinopril (PRINIVIL,ZESTRIL) 40 MG tablet, Take 20 mg by mouth 2 (two) times daily. , Disp: , Rfl:  metoprolol succinate (TOPROL-XL) 100 MG 24 hr tablet, Take 50 mg by mouth 2 (two) times daily. Take 1/2 am  1/2 pm, Disp: , Rfl: ;  oxycodone (OXY-IR) 5 MG capsule, Take 5 mg by mouth every 4 (four) hours as needed for pain., Disp: , Rfl: ;  aspirin 81 MG tablet, Take 81 mg by mouth every evening. , Disp: , Rfl: ;  B Complex-C (B-COMPLEX WITH VITAMIN C) tablet, Take 1 tablet by mouth daily., Disp: , Rfl:  MAGNESIUM CITRATE PO, Take 1 tablet by mouth daily., Disp: , Rfl: ;  POTASSIUM CITRATE PO, Take 1 tablet by mouth daily., Disp: , Rfl: ;  SELENIUM PO, Take 1 tablet by mouth every other day., Disp: , Rfl: ;  VITAMIN A PO, Take 1 capsule by mouth daily., Disp: , Rfl: ;  VITAMIN D, CHOLECALCIFEROL, PO, Take 1 capsule by mouth daily., Disp: , Rfl:  Current facility-administered medications:0.9 %  sodium  chloride infusion, , Intravenous, Continuous, Ascencion Dike, PA-C, Last Rate: 20 mL/hr at 01/28/14 1123, 20 mL/hr at 01/28/14 1123  Results for orders placed during the hospital encounter of 01/28/14 (from the past 48 hour(s))  APTT     Status: None   Collection Time    01/28/14 11:10 AM      Result Value Range   aPTT 34  24 - 37 seconds  CBC     Status: Abnormal   Collection Time    01/28/14 11:10 AM      Result Value Range   WBC 11.3 (*) 4.0 - 10.5 K/uL   RBC 3.61 (*) 4.22 - 5.81 MIL/uL   Hemoglobin 10.2 (*) 13.0 - 17.0 g/dL   HCT 30.8 (*) 39.0 - 52.0 %   MCV 85.3  78.0 - 100.0 fL   MCH 28.3  26.0 - 34.0 pg   MCHC 33.1  30.0 - 36.0 g/dL   RDW 13.7  11.5 - 15.5 %   Platelets 212  150 - 400 K/uL  PROTIME-INR     Status: None   Collection Time    01/28/14 11:10 AM      Result Value Range   Prothrombin Time 14.7  11.6 - 15.2 seconds   INR 1.17  0.00 - 1.49   No results found.  Review of Systems  Constitutional:  Positive for weight loss. Negative for fever and chills.  HENT: Positive for congestion.   Respiratory: Negative for shortness of breath.        Occ cough  Cardiovascular: Negative for chest pain.  Gastrointestinal: Positive for constipation. Negative for nausea, vomiting and abdominal pain.       Prior episode of hematochezia  Genitourinary: Positive for flank pain. Negative for hematuria.  Neurological: Negative for headaches.  Endo/Heme/Allergies: Does not bruise/bleed easily.    Blood pressure 180/86, pulse 75, temperature 98.9 F (37.2 C), temperature source Oral, resp. rate 22, height 5' 11.5" (1.816 m), weight 218 lb (98.884 kg), SpO2 100.00%. Physical Exam  Constitutional: He is oriented to person, place, and time. He appears well-developed and well-nourished.  Cardiovascular: Normal rate and regular rhythm.   Rt chest AICD   Respiratory: Effort normal and breath sounds normal.  GI: Soft. Bowel sounds are normal. There is no tenderness.   Musculoskeletal: Normal range of motion.  Trace edema bilat LE  Neurological: He is alert and oriented to person, place, and time.     Assessment/Plan Patient with history of back pain , weight loss and recent imaging revealing T12 lytic lesion and multiple liver lesions presents today for US guided liver lesion biopsy. Details/risks of procedure d/w pt/wife with their understanding and consent.  Wonda Goodgame,D KEVIN 01/28/2014, 12:45 PM

## 2014-01-28 NOTE — Discharge Instructions (Signed)
Moderate Sedation, Adult °Moderate sedation is given to help you relax or even sleep through a procedure. You may remain sleepy, be clumsy, or have poor balance for several hours following this procedure. Arrange for a responsible adult, family member, or friend to take you home. A responsible adult should stay with you for at least 24 hours or until the medicines have worn off. °· Do not participate in any activities where you could become injured for the next 24 hours, or until you feel normal again. Do not: °· Drive. °· Swim. °· Ride a bicycle. °· Operate heavy machinery. °· Cook. °· Use power tools. °· Climb ladders. °· Work at heights. °· Do not make important decisions or sign legal documents until you are improved. °· Vomiting may occur if you eat too soon. When you can drink without vomiting, try water, juice, or soup. Try solid foods if you feel little or no nausea. °· Only take over-the-counter or prescription medications for pain, discomfort, or fever as directed by your caregiver.If pain medications have been prescribed for you, ask your caregiver how soon it is safe to take them. °· Make sure you and your family fully understands everything about the medication given to you. Make sure you understand what side effects may occur. °· You should not drink alcohol, take sleeping pills, or medications that cause drowsiness for at least 24 hours. °· If you smoke, do not smoke alone. °· If you are feeling better, you may resume normal activities 24 hours after receiving sedation. °· Keep all appointments as scheduled. Follow all instructions. °· Ask questions if you do not understand. °SEEK MEDICAL CARE IF:  °· Your skin is pale or bluish in color. °· You continue to feel sick to your stomach (nauseous) or throw up (vomit). °· Your pain is getting worse and not helped by medication. °· You have bleeding or swelling. °· You are still sleepy or feeling clumsy after 24 hours. °SEEK IMMEDIATE MEDICAL CARE IF:   °· You develop a rash. °· You have difficulty breathing. °· You develop any type of allergic problem. °· You have a fever. °Document Released: 09/10/2001 Document Revised: 03/09/2012 Document Reviewed: 08/23/2013 °ExitCare® Patient Information ©2014 [employer]. °Liver Biopsy °Care After °Refer to this sheet in the next few weeks. These discharge instructions provide you with general information on caring for yourself after you leave the hospital. Your caregiver may also give you specific instructions. Your treatment has been planned according to the most current medical practices available, but unavoidable complications sometimes occur. If you have any problems or questions after discharge, please call your caregiver. °HOME CARE INSTRUCTIONS  °· You should rest for 1 to 2 days or as instructed. °· If you go home the same day as your procedure (outpatient), have a responsible adult take you home and stay with you overnight. °· Do not lift more than 5 pounds or play contact sports for 2 weeks. °· Do not drive for 24 hours after this test. °· Do not take medicine containing aspirin or drink alcohol for 1 week after this test. °· Change bandages (dressings) as directed. °· Only take over-the-counter or prescription medicines for pain, discomfort, or fever as directed by your caregiver. °OBTAINING YOUR TEST RESULTS °Not all test results are available during your visit. If your test results are not back during the visit, make an appointment with your caregiver to find out the results. Do not assume everything is normal if you have not heard from your   caregiver or the medical facility. It is important for you to follow up on all of your test results. °SEEK MEDICAL CARE IF:  °· You have increased bleeding (more than a small spot) from the biopsy site. °· You have redness, swelling, or increasing pain in the biopsy site. °· You have an oral temperature above 102° F (38.9° C). °SEEK IMMEDIATE MEDICAL CARE IF:  °· You  develop swelling or pain in the belly (abdomen). °· You develop a rash. °· You have difficulty breathing, feel short of breath, or feel faint. °· You develop any reaction or side effects to medicines given. °MAKE SURE YOU:  °· Understand these instructions. °· Will watch your condition. °· Will get help right away if you are not doing well or get worse. °Document Released: 07/05/2005 Document Revised: 03/09/2012 Document Reviewed: 07/28/2008 °ExitCare® Patient Information ©2014 [employer]. ° °

## 2014-02-04 ENCOUNTER — Encounter (HOSPITAL_COMMUNITY)
Admission: RE | Admit: 2014-02-04 | Discharge: 2014-02-04 | Disposition: A | Discharge status: Home / Self Care | Payer: Medicare Other | Source: Ambulatory Visit | Attending: Oncology | Admitting: Oncology

## 2014-02-04 DIAGNOSIS — C229 Malignant neoplasm of liver, not specified as primary or secondary: Secondary | ICD-10-CM | POA: Insufficient documentation

## 2014-02-04 LAB — GLUCOSE, CAPILLARY: Glucose-Capillary: 99 mg/dL (ref 70–99)

## 2014-02-04 MED ORDER — FLUDEOXYGLUCOSE F - 18 (FDG) INJECTION
12.3000 | Freq: Once | INTRAVENOUS | Status: AC | PRN
  Administered 2014-02-04: 12.3 via INTRAVENOUS

## 2014-02-08 ENCOUNTER — Encounter: Payer: Self-pay | Admitting: Radiation Oncology

## 2014-02-08 ENCOUNTER — Encounter: Payer: Self-pay | Admitting: Oncology

## 2014-02-08 ENCOUNTER — Telehealth: Payer: Self-pay | Admitting: Oncology

## 2014-02-08 ENCOUNTER — Ambulatory Visit (HOSPITAL_BASED_OUTPATIENT_CLINIC_OR_DEPARTMENT_OTHER): Payer: Medicare Other | Admitting: Oncology

## 2014-02-08 VITALS — BP 135/72 | HR 78 | Temp 97.8°F | Resp 18 | Ht 71.0 in | Wt 215.8 lb

## 2014-02-08 DIAGNOSIS — M545 Low back pain, unspecified: Secondary | ICD-10-CM

## 2014-02-08 DIAGNOSIS — R599 Enlarged lymph nodes, unspecified: Secondary | ICD-10-CM

## 2014-02-08 DIAGNOSIS — C801 Malignant (primary) neoplasm, unspecified: Secondary | ICD-10-CM

## 2014-02-08 DIAGNOSIS — C787 Secondary malignant neoplasm of liver and intrahepatic bile duct: Secondary | ICD-10-CM

## 2014-02-08 DIAGNOSIS — C7952 Secondary malignant neoplasm of bone marrow: Secondary | ICD-10-CM

## 2014-02-08 DIAGNOSIS — C419 Malignant neoplasm of bone and articular cartilage, unspecified: Secondary | ICD-10-CM

## 2014-02-08 DIAGNOSIS — C7951 Secondary malignant neoplasm of bone: Secondary | ICD-10-CM

## 2014-02-08 MED ORDER — HYDROCODONE-ACETAMINOPHEN 10-325 MG PO TABS: 1.0000 | ORAL_TABLET | Freq: Four times a day (QID) | ORAL | Status: AC | PRN

## 2014-02-08 NOTE — Telephone Encounter (Signed)
gv pt appt schedule for march and appt w/dr kinard for 2/11 @ 2pm - s/w rhonda.

## 2014-02-08 NOTE — Progress Notes (Signed)
Hematology and Oncology Follow Up Visit  David Miles [id number] 1934/01/23 80 y.o. 02/08/2014 9:44 AM David Miles, MDNo ref. provider found   Principle Diagnosis: 80 year old gentleman with metastatic carcinoma of unknown primary with disease involvement of the liver, bone and lymphadenopathy diagnosed in January of 2015. He had liver biopsy to confirm the presence of carcinoma etiology likely genitourinary versus squamous cell carcinoma.   Prior Therapy: He is status post liver biopsy on 01/28/2014.  Current therapy: His on that evaluation for upcoming treatment.  Interim History:  David Miles presents today for a followup visit. He is a pleasant gentleman I saw in consultation on 01/25/2014. He had presented with liver metastasis of unknown primary. Since his last visit, he underwent PET CT scan as well as a liver biopsy. Clinically he has been doing rather poorly. His reporting more lower back pain and constipation. He is not reporting any neurological symptoms. His lower and mid back pain is nonradiating and has not responded to oxycodone. He feels out of hydrocodone worked a whole lot better. He has not reported any nausea or vomiting. Did not report any abdominal pain or discomfort. Has not reported any chest pain shortness of breath cough or hemoptysis. Has not reported any headaches blurred vision double vision or change in his neurological symptoms. His performance status is declining but still reasonable.  Medications: I have reviewed the patient's current medications.  Current Outpatient Prescriptions  Medication Sig Dispense Refill  . acetaminophen-codeine (TYLENOL #3) 300-30 MG per tablet Take 1 tablet by mouth every 4 (four) hours as needed for moderate pain.      Marland Kitchen aspirin 81 MG tablet Take 81 mg by mouth every evening.       Marland Kitchen atorvastatin (LIPITOR) 20 MG tablet Take 20 mg by mouth every other day.      . B Complex-C (B-COMPLEX WITH VITAMIN C) tablet Take 1 tablet by  mouth daily.      Marland Kitchen HYDROcodone-acetaminophen (NORCO) 10-325 MG per tablet Take 1 tablet by mouth every 6 (six) hours as needed.  60 tablet  0  . KRILL OIL PO Take 1 capsule by mouth daily.      Marland Kitchen lisinopril (PRINIVIL,ZESTRIL) 40 MG tablet Take 20 mg by mouth 2 (two) times daily.       . metoprolol succinate (TOPROL-XL) 100 MG 24 hr tablet Take 50 mg by mouth 2 (two) times daily. Take 1/2 am  1/2 pm       No current facility-administered medications for this visit.     Allergies: No Known Allergies  Past Medical History, Surgical history, Social history, and Family History were reviewed and updated.  Review of Systems: Constitutional:  Negative for fever, chills, night sweats, anorexia, weight loss, pain. Cardiovascular: no chest pain or dyspnea on exertion Respiratory: no cough, shortness of breath, or wheezing Neurological: no TIA or stroke symptoms Dermatological: negative for pruritus and rash ENT: negative for - epistaxis or sore throat Skin: Negative. Gastrointestinal: negative for - abdominal pain, or diarrhea Genito-Urinary: negative for - hematuria Hematological and Lymphatic: negative for - bruising, fatigue or jaundice Breast: negative Musculoskeletal: positive for - Back pain.  Remaining ROS negative. Physical Exam: Blood pressure 135/72, pulse 78, temperature 97.8 F (36.6 C), temperature source Oral, resp. rate 18, height 5\' 11"  (1.803 m), weight 215 lb 12.8 oz (97.886 kg). ECOG: 1 General appearance: alert, cooperative and appears stated age Head: Normocephalic, without obvious abnormality, atraumatic Neck: no adenopathy, no carotid bruit, no JVD, supple, symmetrical,  trachea midline and thyroid not enlarged, symmetric, no tenderness/mass/nodules Lymph nodes: Cervical, supraclavicular, and axillary nodes normal. Heart:regular rate and rhythm, S1, S2 normal, no murmur, click, rub or gallop Lung:chest clear, no wheezing, rales, normal symmetric air entry Abdomin:  soft, non-tender, without masses or organomegaly EXT:no erythema, induration, or nodules Neurological exam: No deficits noted with his motor sensory or and deep tendon reflexes.  Lab Results: Lab Results  Component Value Date   WBC 11.3* 01/28/2014   HGB 10.2* 01/28/2014   HCT 30.8* 01/28/2014   MCV 85.3 01/28/2014   PLT 212 01/28/2014     Chemistry      Component Value Date/Time   NA 141 01/24/2014 1359   NA 139 08/03/2011 1721   K 4.7 01/24/2014 1359   K 4.9 08/03/2011 1721   CL 108 08/03/2011 1721   CO2 29 01/24/2014 1359   CO2 32 12/07/2007 1025   BUN 45.4* 01/24/2014 1359   BUN 30* 08/03/2011 1721   CREATININE 2.5* 01/24/2014 1359   CREATININE 1.70* 08/03/2011 1721      Component Value Date/Time   CALCIUM 13.7* 01/24/2014 1359   CALCIUM 9.7 12/07/2007 1025   ALKPHOS 105 01/24/2014 1359   ALKPHOS 71 07/29/2007 1132   AST 20 01/24/2014 1359   AST 25 07/29/2007 1132   ALT 18 01/24/2014 1359   ALT 20 07/29/2007 1132   BILITOT 0.50 01/24/2014 1359   BILITOT 0.6 07/29/2007 1132       Radiological Studies:  EXAM:  NUCLEAR MEDICINE PET SKULL BASE TO THIGH  FASTING BLOOD GLUCOSE: Value: 99 mg/dl  TECHNIQUE:  12.3 mCi F-18 FDG was injected intravenously. CT data was obtained  and used for attenuation correction and anatomic localization only.  (This was not acquired as a diagnostic CT examination.) Additional  exam technical data entered on technologist worksheet.  COMPARISON: US BIOPSY dated 01/28/2014; CT UROGRAM dated 01/14/2014  FINDINGS:  NECK  Hypermetabolic left supraclavicular lymph nodes. Hypermetabolic  small left level IIa lymph node.  CHEST  Extensive hypermetabolic paratracheal and right perihilar  adenopathy. Example right hilar nodes with SUV max 21.9. Equally  hypermetabolic subcarinal nodes in lower paratracheal nodes. No  hypermetabolic pulmonary nodules.  ABDOMEN/PELVIS  There is widespread intense hypermetabolic hepatic metastasis. The  inferior left hepatic lobe  (segment 3) is near completely replaced  by tumor SUV max 30.9. There are multiple round metastasis with the  right hepatic lobe with SUV max 30.0. Approximately 20 discrete  metastasis within the liver.  There is a hypermetabolic small periportal lymph nodes  No abnormal metabolic activity associated with the pancreas. No  focal metabolic activity colon are prostate gland.  SKELETON  Widespread skeletal metastasis. Again demonstrated lytic lesion at  T12 with intense metabolic activity (SUV max 25.5). Additional  skeletal metastasis include the TT vertebral body at the level right  facet. Medial first rib on the left costal chondral vertebral  junction (image 54). Bilateral rib lesions.  There is a focus of uptake within the right sacral ala with SUV max  19.5. Additional focus of metastasis within the left iliac bone.  IMPRESSION:  1. Widespread hypermetabolic metastasis involving the liver,  skeleton, mediastinal lymph nodes, and supraclavicular lymph nodes.  2. No localization of primary carcinoma.   Impression and Plan:   80 year old gentleman with the following issues:  1. 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 There is no clear-cut primary identified and the pathology suggest a genitourinary etiology versus unknown  primary. The natural course of these diseases were discussed today with the patient and his family. He understands that any maneuver would be merely palliative and the prognosis is rather poor. I believe systemic chemotherapy can offer some palliation of symptoms including decrease in the overall decline in his performance status, decreased pain and possibly improve his performance status. I would currently use combination of chemotherapy that it is utilized in genitourinary cancer such as carboplatin and Gemzar. I think this combination should be effective in most unknown primaries as well. Risks and benefits of this  combination was discussed today in detail and is agreeable to proceed after possible radiation therapy. Complications include myelosuppression, GI toxicity, neutropenia neutropenic sepsis, infusion related toxicity and no renal toxicity are one of possible complications.  2. T12 bony involvement. This represents rather critical lesion that needs to be addressed sooner rather than later and I will refer him to radiation oncology for possible palliative radiation to that area. He might need surgical stabilization of that area if radiation is not an option or surgery may be a better option. Unfortunately his prognosis from his cancer is rather poor.  3. Pain: He prefers to have hydrocodone which have helped him in the past and I will give him a prescription for that today.  4. Followup I will sign him up with a quick followup after completion of radiation to commence chemotherapy.  [facility], MD 2/10/20159:44 AM

## 2014-02-08 NOTE — Progress Notes (Signed)
Histology and Location of Primary Cancer:Stage IV carcinoma with involvement of the liver, spine and diffuse lymphadenopathy. There is no clear-cut primary identified and the pathology suggest a genitourinary etiology versus unknown primary  Sites of Visceral and Bony Metastatic Disease: liver, spine (T12), diffuse lymphadenopathy  Location(s) of Symptomatic Metastases: T12  Past/Anticipated chemotherapy by medical oncology, if any: Per Dr. Hazeline Junker note - carboplatin and Gemzar after radiation.  Pain on a scale of 0-10 is: Rates pain at a 5/10. Takes norco 10/325 mg q 6 hours PRN.   If Spine Met(s), symptoms, if any, include:  Bowel/Bladder retention or incontinence (please describe): had bladder issues for years, is supposed to self cath daily but can't due to pain in his back.  Numbness or weakness in extremities (please describe): left forearm goes to sleep.  Current Decadron regimen, if applicable: no  Ambulatory status? Walker? Wheelchair?: ambulatory at home, in a wheelchair here.  SAFETY ISSUES:  Prior radiation? no  Pacemaker/ICD? Yes - right side.  Possible current pregnancy? no  Is the patient on methotrexate? no  Current Complaints / other details:  Has lost 22 lbs since January 16.  Is a retired [employer].  Has two adult children.

## 2014-02-09 ENCOUNTER — Encounter: Payer: Self-pay | Admitting: Radiation Oncology

## 2014-02-09 ENCOUNTER — Ambulatory Visit
Admission: RE | Admit: 2014-02-09 | Discharge: 2014-02-09 | Disposition: A | Discharge status: Home / Self Care | Payer: Medicare Other | Source: Ambulatory Visit | Attending: Radiation Oncology | Admitting: Radiation Oncology

## 2014-02-09 VITALS — BP 147/79 | HR 82 | Temp 97.5°F | Ht 72.0 in | Wt 215.8 lb

## 2014-02-09 DIAGNOSIS — Z87891 Personal history of nicotine dependence: Secondary | ICD-10-CM | POA: Insufficient documentation

## 2014-02-09 DIAGNOSIS — Z8042 Family history of malignant neoplasm of prostate: Secondary | ICD-10-CM | POA: Insufficient documentation

## 2014-02-09 DIAGNOSIS — C419 Malignant neoplasm of bone and articular cartilage, unspecified: Secondary | ICD-10-CM

## 2014-02-09 DIAGNOSIS — C787 Secondary malignant neoplasm of liver and intrahepatic bile duct: Secondary | ICD-10-CM | POA: Insufficient documentation

## 2014-02-09 DIAGNOSIS — Z803 Family history of malignant neoplasm of breast: Secondary | ICD-10-CM | POA: Insufficient documentation

## 2014-02-09 DIAGNOSIS — C801 Malignant (primary) neoplasm, unspecified: Secondary | ICD-10-CM | POA: Insufficient documentation

## 2014-02-09 DIAGNOSIS — C7952 Secondary malignant neoplasm of bone marrow: Principal | ICD-10-CM

## 2014-02-09 DIAGNOSIS — C7951 Secondary malignant neoplasm of bone: Secondary | ICD-10-CM | POA: Insufficient documentation

## 2014-02-09 DIAGNOSIS — Z807 Family history of other malignant neoplasms of lymphoid, hematopoietic and related tissues: Secondary | ICD-10-CM | POA: Insufficient documentation

## 2014-02-09 DIAGNOSIS — Z79899 Other long term (current) drug therapy: Secondary | ICD-10-CM | POA: Insufficient documentation

## 2014-02-09 HISTORY — DX: Malignant (primary) neoplasm, unspecified: C80.1

## 2014-02-09 HISTORY — DX: Secondary malignant neoplasm of liver and intrahepatic bile duct: C78.7

## 2014-02-09 NOTE — Progress Notes (Signed)
Please see the Nurse Progress Note in the MD Initial Consult Encounter for this patient. 

## 2014-02-09 NOTE — Progress Notes (Signed)
Radiation Oncology         [phone] ________________________________  Initial outpatient Consultation  Name: David Miles MRN: [id number]  Date: 02/09/2014  DOB: [birth year]  [id number] David Canary, MD  David Portela, MD   REFERRING PHYSICIAN: Wyatt Portela, MD  DIAGNOSIS: Metastatic carcinoma of unknown primary, possibly urothelial or squamous cell carcinoma   HISTORY OF PRESENT ILLNESS::David Miles is a 80 y.o. male who is seen out courtesy of Dr. Alen Miles for an opinion concerning radiation therapy as part of management of patient's recently diagnosed carcinoma of unknown primary. Patient presented with a bilateral lower rib cage/flank pain recently after falling in his bathroom. Patient was seen by his urologist who ordered a CT scan. The scans presently showed multiple liver metastasis. Biopsy was performed which revealed metastatic carcinoma, poorly differentiated. Morphologic and  phenotype with differential included squamous cell carcinoma and urothelial carcinoma. Patient was seen by medical oncology and a PET scan was performed which showed widespread intense hypermetabolic liver metastasis.   patient was also found to have widespread skeletal metastasis. Of note was a significant lesion at T12 as well as L3 on the right side. In Light of the patient's pain and x-ray findings he is now seen in radiation oncology for consideration for palliative treatments.Marland Kitchen  PREVIOUS RADIATION THERAPY: No  PAST MEDICAL HISTORY:  has a past medical history of CHF (congestive heart failure); Cancer; and Metastases to the liver.    PAST SURGICAL HISTORY: Past Surgical History  Procedure Laterality Date  . Colon surgery  2005  . Abdominal aortic aneurysm repair  09/2011  . Permanent pacemaker insertion    . Urethroplasty  1961  . Vasectomy    . Scalp laceration repair  2012    FAMILY HISTORY: family history includes Breast cancer in his maternal aunt and maternal aunt; Hodgkin's  lymphoma in his brother; Prostate cancer in his brother.  SOCIAL HISTORY:  reports that he quit smoking about 15 years ago. He does not have any smokeless tobacco history on file. He reports that he drinks alcohol. He reports that he does not use illicit drugs.  ALLERGIES: Review of patient's allergies indicates no known allergies.  MEDICATIONS:  Current Outpatient Prescriptions  Medication Sig Dispense Refill  . acetaminophen-codeine (TYLENOL #3) 300-30 MG per tablet Take 1 tablet by mouth every 4 (four) hours as needed for moderate pain.      Marland Kitchen aspirin 81 MG tablet Take 81 mg by mouth every evening.       Marland Kitchen atorvastatin (LIPITOR) 20 MG tablet Take 20 mg by mouth every other day.      . B Complex-C (B-COMPLEX WITH VITAMIN C) tablet Take 1 tablet by mouth daily.      Marland Kitchen HYDROcodone-acetaminophen (NORCO) 10-325 MG per tablet Take 1 tablet by mouth every 6 (six) hours as needed.  60 tablet  0  . KRILL OIL PO Take 1 capsule by mouth daily.      Marland Kitchen lisinopril (PRINIVIL,ZESTRIL) 40 MG tablet Take 20 mg by mouth 2 (two) times daily.       . metoprolol succinate (TOPROL-XL) 100 MG 24 hr tablet Take 50 mg by mouth 2 (two) times daily. Take 1/2 am  1/2 pm       No current facility-administered medications for this encounter.    REVIEW OF SYSTEMS:  A 15 point review of systems is documented in the electronic medical record. This was obtained by the nursing staff. However, I reviewed this with the patient  to discuss relevant findings and make appropriate changes.  He denies any headaches or visual difficulties. He denies any double vision. Patient complains of pain in the mid and lower back region. His back pain is the major issue in terms of his overall quality of life at this time. Patient complains of generalized fatigue but no focal motor weakness. Patient occasionally will have difficulties with the emptying his bladder but this is not a new problem for him. He denies any numbness or tingling in his  lower extremities.  He has a poor appetite and has lost approximately 25-30 pounds   PHYSICAL EXAM:  height is 6' (1.829 m) and weight is 215 lb 12.8 oz (97.886 kg). His temperature is 97.5 F (36.4 C). His blood pressure is 147/79 and his pulse is 82. His oxygen saturation is 98%.   BP 147/79  Pulse 82  Temp(Src) 97.5 F (36.4 C)  Ht 6' (1.829 m)  Wt 215 lb 12.8 oz (97.886 kg)  BMI 29.26 kg/m2  SpO2 98%  General Appearance:    Alert, cooperative, no distress, appears stated age, accompanied by wife today. He sits in a wheelchair for most of the examination   Head:    Normocephalic, without obvious abnormality, atraumatic  Eyes:    PERRL, conjunctiva/corneas clear, EOM's intact,         Ears:    Normal TM's and external ear canals, both ears  Nose:   Nares normal, septum midline, mucosa normal, no drainage    or sinus tenderness  Throat:   Lips, mucosa, and tongue normal;  gums normal  Neck:   Supple, symmetrical, trachea midline, no adenopathy;       thyroid:  No enlargement/tenderness/nodules; no carotid   bruit or JVD  Back:     Symmetric, no curvature, ROM normal, no CVA tenderness  Lungs:     Clear to auscultation bilaterally, respirations unlabored  Chest wall:    No tenderness or deformity  Heart:    Regular rate and rhythm, S1 and S2 normal, no murmur, rub   or gallop  Abdomen:     Soft, non-tender, bowel sounds active all four quadrants,    no masses, no organomegaly        Extremities:   Extremities normal, atraumatic, some edema and ankle and foot areas. edema  Pulses:   2+ and symmetric all extremities  Skin:   Skin color, texture, turgor normal, no rashes or lesions  Lymph nodes:   Cervical, supraclavicular, and axillary nodes normal  Neurologic:   CNII-XII intact. Normal strength, sensation and reflexes      Throughout, no significant point tenderness along the spine     ECOG = 2   2 - Symptomatic, <50% in bed during the day (Ambulatory and capable of all self  care but unable to carry out any work activities. Up and about more than 50% of waking hours)    LABORATORY DATA:  Lab Results  Component Value Date   WBC 11.3* 01/28/2014   HGB 10.2* 01/28/2014   HCT 30.8* 01/28/2014   MCV 85.3 01/28/2014   PLT 212 01/28/2014   Lab Results  Component Value Date   NA 141 01/24/2014   K 4.7 01/24/2014   CL 108 08/03/2011   CO2 29 01/24/2014   Lab Results  Component Value Date   ALT 18 01/24/2014   AST 20 01/24/2014   ALKPHOS 105 01/24/2014   BILITOT 0.50 01/24/2014     RADIOGRAPHY: Nm Pet Image  Initial (pi) Skull Base To Thigh  02/04/2014   CLINICAL DATA:  Initial treatment strategy for liver carcinoma.  EXAM: NUCLEAR MEDICINE PET SKULL BASE TO THIGH  FASTING BLOOD GLUCOSE:  Value: 99 mg/dl  TECHNIQUE: 12.3 mCi F-18 FDG was injected intravenously. CT data was obtained and used for attenuation correction and anatomic localization only. (This was not acquired as a diagnostic CT examination.) Additional exam technical data entered on technologist worksheet.  COMPARISON:  US BIOPSY dated 01/28/2014; CT UROGRAM dated 01/14/2014  FINDINGS: NECK  Hypermetabolic left supraclavicular lymph nodes. Hypermetabolic small left level IIa lymph node.  CHEST  Extensive hypermetabolic paratracheal and right perihilar adenopathy. Example right hilar nodes with SUV max 21.9. Equally hypermetabolic subcarinal nodes in lower paratracheal nodes. No hypermetabolic pulmonary nodules.  ABDOMEN/PELVIS  There is widespread intense hypermetabolic hepatic metastasis. The inferior left hepatic lobe (segment 3) is near completely replaced by tumor SUV max 30.9. There are multiple round metastasis with the right hepatic lobe with SUV max 30.0. Approximately 20 discrete metastasis within the liver.  There is a hypermetabolic small periportal lymph nodes  No abnormal metabolic activity associated with the pancreas. No focal metabolic activity colon are prostate gland.  SKELETON  Widespread skeletal  metastasis. Again demonstrated lytic lesion at T12 with intense metabolic activity (SUV max 25.5). Additional skeletal metastasis include the TT vertebral body at the level right facet. Medial first rib on the left costal chondral vertebral junction (image 54). Bilateral rib lesions.  There is a focus of uptake within the right sacral ala with SUV max 19.5. Additional focus of metastasis within the left iliac bone.  IMPRESSION: 1. Widespread hypermetabolic metastasis involving the liver, skeleton, mediastinal lymph nodes, and supraclavicular lymph nodes. 2. No localization of primary carcinoma.   Electronically Signed   By: Suzy Bouchard M.D.   On: 02/04/2014 15:03   US Biopsy  01/28/2014   INDICATION: Unknown primary, now with multiple liver lesions worrisome for metastatic disease.  EXAM: ULTRASOUND GUIDED LIVER LESION BIOPSY  MEDICATIONS: Fentanyl 50 mcg IV; Versed 3.5 mg IV  ANESTHESIA/SEDATION: Total Moderate Sedation time  10 minutes  COMPARISON:  CT abdomen pelvis - 01/14/2014  PROCEDURE: Informed written consent was obtained from the patient after a discussion of the risks, benefits and alternatives to treatment. The patient understands and consents the procedure. A timeout was performed prior to the initiation of the procedure.  Ultrasound scanning was performed of the right upper abdominal quadrant demonstrates multiple mixed echogenic largely hypoechoic solid lesions within both the right and left lobe of the liver compatible with the lesion seen on recently performed noncontrast abdominal CT. Dominant approximately 2.8 x 2.1 cm slightly hypoechoic solid mass within in the lateral segment of the left lobe of the liver (likely correlating with the ill-defined hypoattenuating lesion seen on preprocedural noncontrast abdominal CT image 7, series 2) was targeted for biopsy. The procedure was planned.  The right upper abdominal quadrant was prepped and draped in the usual sterile fashion. The overlying  soft tissues were anesthetized with 1% lidocaine with epinephrine. A 17 gauge, 6.8 cm co-axial needle was advanced into a peripheral aspect of the lesion. This was followed by 4 core biopsies with an 18 gauge core device under direct ultrasound guidance. Multiple spot ultrasound images were saved for documentation purposes.  The co-axial needle was removed and hemostasis was obtained with manual compression. Post procedural scanning was negative for definitive area of hemorrhage or additional complication. A dressing was placed. The patient tolerated the  procedure well without immediate post procedural complication.  COMPLICATIONS: None immediate  IMPRESSION: Technically successful ultrasound guided core needle biopsy of indeterminate hypoechoic solid mass within the left lobe of the liver.   Electronically Signed   By: Sandi Mariscal M.D.   On: 01/28/2014 16:23      IMPRESSION: Metastatic carcinoma of unknown primary. Patient is quite debilitated from his osseous metastasis. This is having a major impact on his quality of life. The patient would be a candidate for palliative radiation therapy directed at the T12 area and possibly L3 region. Patient does wish to consider treatment for these issues. I discussed conventional fractionation radiation therapy and possibly SRS directed at the T12 region and possibly L3. Patient may potentially be a candidate for vertebroplasty directed at the T12 area in addition. Patient would like to pursue SRS if technically possible.  PLAN: Patient will undergo consultation with the [facility] team in the near future. Final details concerning the patient's treatment are pending this evaluation with radiation oncology and neurosurgery. I spent 60 minutes minutes face to face with the patient and more than 50% of that time was spent in counseling and/or coordination of care.   ------------------------------------------------  -----------------------------------  Blair Promise, PhD,  MD

## 2014-02-10 ENCOUNTER — Other Ambulatory Visit: Payer: Self-pay | Admitting: Radiation Therapy

## 2014-02-10 ENCOUNTER — Encounter: Payer: Self-pay | Admitting: Radiation Oncology

## 2014-02-10 DIAGNOSIS — C7931 Secondary malignant neoplasm of brain: Secondary | ICD-10-CM

## 2014-02-10 DIAGNOSIS — C7949 Secondary malignant neoplasm of other parts of nervous system: Principal | ICD-10-CM

## 2014-02-10 NOTE — Progress Notes (Signed)
Spine Instability Neoplastic Score (SINS): SINS Component Description Score  Location Junctional (Occ-C2, C7-T2, T11-L1, L5-S1) Mobile (C3-6, L2-4) Semirigid (T3-10) Rigid (S2-5) 3 2 1  0  Pain Yes Occasional, non-mechanical No 3 1 0  Bone Lesion Lytic Mixed Blastic 2 1 0  Alignment Subluxation/Translation De Novo deformity Normal 4 2 0  Vertebral Body >50% collapse <50% collapse No collapse >50% VB involved None of above 3 2 1  0  Posterolateral Involvment Bilateral Unilateral 3 1   Tallied Score from 6 Components: Stable Potentially Unstable Unstable  0-6 7-12 13-18   SINS Score: 10  (T12 vertebral body)   Fisher CG, et al. A novel classification system for spinal instability in neoplastic disease: an evidence-based approach and expert consensus from the Spine Oncology Study Group. Spine  83(25):Q9826-4, 2010

## 2014-02-11 ENCOUNTER — Ambulatory Visit
Admission: RE | Admit: 2014-02-11 | Discharge: 2014-02-11 | Disposition: A | Discharge status: Home / Self Care | Payer: Medicare Other | Source: Ambulatory Visit | Attending: Radiation Oncology | Admitting: Radiation Oncology

## 2014-02-11 DIAGNOSIS — Z510 Encounter for antineoplastic radiation therapy: Secondary | ICD-10-CM | POA: Insufficient documentation

## 2014-02-11 DIAGNOSIS — C419 Malignant neoplasm of bone and articular cartilage, unspecified: Secondary | ICD-10-CM

## 2014-02-11 DIAGNOSIS — C7952 Secondary malignant neoplasm of bone marrow: Secondary | ICD-10-CM

## 2014-02-11 DIAGNOSIS — C7951 Secondary malignant neoplasm of bone: Secondary | ICD-10-CM | POA: Insufficient documentation

## 2014-02-11 DIAGNOSIS — C801 Malignant (primary) neoplasm, unspecified: Secondary | ICD-10-CM | POA: Insufficient documentation

## 2014-02-11 NOTE — Progress Notes (Signed)
Pacmaker form refaxed to [place] at Dr.Drucker's office.confirmed receipt of form but Dr.Drucker out of office until Monday 02/14/14.Pacemaker last checked remotely from home on 02/10/14.

## 2014-02-14 ENCOUNTER — Other Ambulatory Visit: Payer: Medicare Other

## 2014-02-14 ENCOUNTER — Ambulatory Visit
Admission: RE | Admit: 2014-02-14 | Discharge: 2014-02-14 | Disposition: A | Discharge status: Home / Self Care | Payer: Medicare Other | Source: Ambulatory Visit | Attending: Radiation Oncology | Admitting: Radiation Oncology

## 2014-02-14 ENCOUNTER — Other Ambulatory Visit: Payer: Self-pay | Admitting: Radiation Oncology

## 2014-02-14 DIAGNOSIS — Z515 Encounter for palliative care: Secondary | ICD-10-CM

## 2014-02-14 DIAGNOSIS — C229 Malignant neoplasm of liver, not specified as primary or secondary: Secondary | ICD-10-CM

## 2014-02-14 MED ORDER — OXYCODONE HCL ER 15 MG PO T12A: 15.0000 mg | EXTENDED_RELEASE_TABLET | Freq: Two times a day (BID) | ORAL | Status: AC

## 2014-02-14 MED ORDER — OXYCODONE HCL 5 MG PO TABS: 5.0000 mg | ORAL_TABLET | ORAL | Status: AC | PRN

## 2014-02-14 NOTE — Progress Notes (Signed)
PATIENT ARRIVED VIA W/C, VITALS WEIGHT TAKEN, PAIN RIGHT LOWER BACK 'HURTS A LITTLE,i TOOK MY PAIN MED AT 630AM", stated patient,  Handicap paperwork given to wife per her request, also David Miles ,palliative nurse to speak with help for wife taking care of husband at home, Dr,Stermn in to see patient and wife to discuss [place] treatment .now

## 2014-02-14 NOTE — Progress Notes (Signed)
  Radiation Oncology         (336) [phone] ________________________________  Name: David Miles MRN: [id number]  Date: 02/14/2014  DOB: 1934/12/14  Chart Note:  Primary Radiation Oncologist:  Kyung Rudd, MD, PhD  Narrative:  This patient was seen today in the clinic by Dr. Vertell Limber from neurosurgery and Wadie Lessen from palliative care.  He had been taking hydrocodone, which provided insufficient pain relief.  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 ________________________________  Sheral Apley Tammi Klippel, M.D.

## 2014-02-14 NOTE — Consult Note (Signed)
Patient SW:David Miles      DOB: [birth year]      VOJ:[id number]     Consult Note from the Palliative Medicine Team at [facility] Requested by:  Dr David Miles     PCP: David Rouse, MD Reason for Consultation: Clarification of [place] and options     Phone Number:[phone]  Assessment of patients Current state:  Recently diagnosised metastatic carcinoma, liver biopsy revealed poorly differentiated carcinoma.  Patient was seen by medical oncology and a PET scan was performed which showed widespread intense hypermetabolic liver metastasis. patient was also found to have widespread skeletal metastasis.  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   Has scheduled appointment with Dr David Miles on March 02, 2014   Consult is for review of medical treatment options, clarification of goals of care and end of life issues, disposition and options, and symptom recommendation.  This NP David Miles reviewed medical records, received report from team, assessed the patient and then meet with  the patient's wife David Miles # [phone] to discuss diagnosis prognosis, GOC, and options.   A detailed discussion was had today regarding advanced directives.  Concepts specific to code status, artifical feeding and hydration, continued IV antibiotics and rehospitalization was had.  The difference between a aggressive medical intervention path  and a palliative comfort care path for this patient at this time was had.  Values and goals of care important to patient and family were attempted to be elicited.  Concept of Hospice and Palliative Care were discussed  Questions and concerns addressed.  Hard Choices booklet left for review. Family encouraged to call with questions or concerns.  PMT will continue to support holistically.     Goals of Care: 1.  Code Status:  DNR/DNI   2. Scope of Treatment:  Patient is in the process of gathering information regarding possible treatment options.  He understands the seriousness of his disease and clearly states that comfort is a priority    3. Symptom Management:  Pain management is a high priority for David Miles.  Presently he is utilizing Hydrocodone 10/325  Tablets 4-6 times daily and is "not comfortable"   We discussed pain management strategies utilizing ER medications with IR medications for breakthrough pain, and making adjustments when necessary. Recommendations and discussed with Dr David Miles 1. Pain: OxyContin 15 mg po every 12 hrs scheduled                  Oxycodone 5 mg po every 4 hrs prn  2. Bowel Regimen:  Senna-s -Take 1-2 tablets every night at bedtime  Encouraged to call with questions or concerns  4. Psychosocial:  Emotional support offered to patient and his wife.  They were both comfortable taking about present medical situation and its impact on their lives over the past month.     5. Spiritual:  Strong community church support    Patient Documents Completed or Given: Document Given Completed  Advanced Directives Pkt    MOST yes   DNR    Gone from My Sight    Hard Choices yes     Brief CVE:LFYB David Miles is a 80 y.o. male who is seen out courtesy of Dr. Alen Miles for an opinion concerning radiation therapy as part of management of patient's recently diagnosed carcinoma of unknown primary. Patient presented with a bilateral  lower rib cage/flank pain recently after falling in his bathroom. Patient was seen by his urologist who ordered a CT scan. The scans presently showed multiple liver metastasis. Biopsy was performed which revealed metastatic carcinoma, poorly differentiated. Morphologic and phenotype with differential included squamous cell carcinoma and urothelial carcinoma. Patient was seen by medical oncology and a PET scan was performed which showed widespread intense  hypermetabolic liver metastasis. patient was also found to have widespread skeletal metastasis. Of note was a significant lesion at T12 as well as L3 on the right side. 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 CHF (congestive heart failure)   . Cancer   . Metastases to the liver      PSH: Past Surgical History  Procedure Laterality Date  . Colon surgery  2005  . Abdominal aortic aneurysm repair  09/2011  . Permanent pacemaker insertion    . Urethroplasty  1961  . Vasectomy    . Scalp laceration repair  2012   I have reviewed the [place] and SH and  If appropriate update it with new information. No Known Allergies Scheduled Meds: Continuous Infusions: PRN Meds:.    BP 127/69  Pulse 80  Temp(Src) 97.8 F (36.6 C) (Oral)  Resp 20  Wt 97.07 kg (214 lb)  SpO2 94%   PPS: 50%  No intake or output data in the 24 hours ending 02/14/14 1104 LBM: 02-13-14 per patient                       Physical Exam:  General: Chronically ill appearing, NAD HEENT:  Mm, no exudate Chest: CTA CVS: RRR Ext: without edema Neuro:alert and oriented X3, full capacity Psych: good insight into his present medical sitaution   Labs: CBC    Component Value Date/Time   WBC 11.3* 01/28/2014 1110   WBC 11.5* 01/24/2014 1358   RBC 3.61* 01/28/2014 1110   RBC 3.77* 01/24/2014 1358   HGB 10.2* 01/28/2014 1110   HGB 10.6* 01/24/2014 1358   HCT 30.8* 01/28/2014 1110   HCT 32.1* 01/24/2014 1358   PLT 212 01/28/2014 1110   PLT 226 01/24/2014 1358   MCV 85.3 01/28/2014 1110   MCV 85.1 01/24/2014 1358   MCH 28.3 01/28/2014 1110   MCH 28.1 01/24/2014 1358   MCHC 33.1 01/28/2014 1110   MCHC 33.0 01/24/2014 1358   RDW 13.7  01/28/2014 1110   RDW 13.8 01/24/2014 1358   LYMPHSABS 1.3 01/24/2014 1358   LYMPHSABS 2.1 08/03/2011 1703   MONOABS 0.9 01/24/2014 1358   MONOABS 0.6 08/03/2011 1703   EOSABS 0.2 01/24/2014 1358   EOSABS 0.1 08/03/2011 1703   BASOSABS 0.0 01/24/2014 1358   BASOSABS 0.0 08/03/2011 1703    BMET    Component Value Date/Time   NA 141 01/24/2014 1359   NA 139 08/03/2011 1721   K 4.7 01/24/2014 1359   K 4.9 08/03/2011 1721   CL 108 08/03/2011 1721   CO2 29 01/24/2014 1359   CO2 32 12/07/2007 1025   GLUCOSE 135 01/24/2014 1359   GLUCOSE 97 08/03/2011 1721   BUN 45.4* 01/24/2014 1359   BUN 30* 08/03/2011 1721  CREATININE 2.5* 01/24/2014 1359   CREATININE 1.70* 08/03/2011 1721   CALCIUM 13.7* 01/24/2014 1359   CALCIUM 9.7 12/07/2007 1025   GFRNONAA 55* 12/07/2007 1025   GFRAA  Value: >60        The eGFR has been calculated using the MDRD equation. This calculation has not been validated in all clinical 12/07/2007 1025    CMP     Component Value Date/Time   NA 141 01/24/2014 1359   NA 139 08/03/2011 1721   K 4.7 01/24/2014 1359   K 4.9 08/03/2011 1721   CL 108 08/03/2011 1721   CO2 29 01/24/2014 1359   CO2 32 12/07/2007 1025   GLUCOSE 135 01/24/2014 1359   GLUCOSE 97 08/03/2011 1721   BUN 45.4* 01/24/2014 1359   BUN 30* 08/03/2011 1721   CREATININE 2.5* 01/24/2014 1359   CREATININE 1.70* 08/03/2011 1721   CALCIUM 13.7* 01/24/2014 1359   CALCIUM 9.7 12/07/2007 1025   PROT 7.5 01/24/2014 1359   PROT 6.3 07/29/2007 1132   ALBUMIN 3.1* 01/24/2014 1359   ALBUMIN 3.0* 07/29/2007 1132   AST 20 01/24/2014 1359   AST 25 07/29/2007 1132   ALT 18 01/24/2014 1359   ALT 20 07/29/2007 1132   ALKPHOS 105 01/24/2014 1359   ALKPHOS 71 07/29/2007 1132   BILITOT 0.50 01/24/2014 1359   BILITOT 0.6 07/29/2007 1132   GFRNONAA 55* 12/07/2007 1025   GFRAA  Value: >60        The eGFR has been calculated using the MDRD equation. This calculation has not been validated in all clinical 12/07/2007 1025     Time In Time Out Total Time Spent with Patient  Total Overall Time  0900 1020 75 min 80 min    Greater than 50%  of this time was spent counseling and coordinating care related to the above assessment and plan.  David Lessen NP  Palliative Medicine Team Team Phone # [phone] Pager [phone]  Discussed with Dr David Miles

## 2014-02-15 ENCOUNTER — Telehealth: Payer: Self-pay | Admitting: *Deleted

## 2014-02-15 ENCOUNTER — Other Ambulatory Visit: Payer: Self-pay | Admitting: Radiation Oncology

## 2014-02-15 NOTE — Telephone Encounter (Signed)
Called to check on status of pacemaker form, ,recording that office was closed due to the weather,will try again tomorrow 10:41 AM

## 2014-02-16 ENCOUNTER — Telehealth: Payer: Self-pay | Admitting: *Deleted

## 2014-02-16 NOTE — Telephone Encounter (Signed)
[place] [facility] , left call back on voice message, lines busy,awaiting call to chcek on pacemaker form to be faxed back to [place] by Dr.Drucker,  9:46 AM

## 2014-02-17 ENCOUNTER — Other Ambulatory Visit: Payer: Self-pay | Admitting: Radiation Oncology

## 2014-02-17 NOTE — Progress Notes (Signed)
[facility] Psychosocial Distress Screening Clinical Social Work  Clinical Social Work was referred by distress screening protocol.  The patient scored a 8 on the Psychosocial Distress Thermometer which indicates severe distress. Clinical Social Worker Intern telephoned to assess for distress and other psychosocial needs.  Patient stated he is still dealing with a lot of pain.  Patient noted that he had a new prescription for pain medicine that he was not able to fill until 2/20 and was hope it would address pain.  Patient stated that everything else was fine.  CSWI shared available services with Patient and Patient indicated he would seek out if further assistance was needed.   Clinical Social Worker follow up needed: no  If yes, follow up plan:   Circe Chilton S. [place] Work Intern [employer] [phone]

## 2014-02-18 ENCOUNTER — Telehealth: Payer: Self-pay | Admitting: *Deleted

## 2014-02-18 ENCOUNTER — Ambulatory Visit
Admission: RE | Admit: 2014-02-18 | Discharge: 2014-02-18 | Disposition: A | Discharge status: Home / Self Care | Payer: Medicare Other | Source: Ambulatory Visit | Attending: Radiation Oncology | Admitting: Radiation Oncology

## 2014-02-18 VITALS — BP 179/88 | HR 80 | Temp 97.7°F | Resp 18

## 2014-02-18 DIAGNOSIS — C7952 Secondary malignant neoplasm of bone marrow: Principal | ICD-10-CM

## 2014-02-18 DIAGNOSIS — C7951 Secondary malignant neoplasm of bone: Secondary | ICD-10-CM

## 2014-02-18 NOTE — Progress Notes (Signed)
  Radiation Oncology         (336) [phone] ________________________________  Name: FAYETTE HAMADA MRN: [id number]  Date: 02/18/2014  DOB: 03-25-1934  End of Treatment Note  Diagnosis:   Metastatic carcinoma with bony metastasis to the spine     Indication for treatment:  Palliative       Radiation treatment dates:   02/18/2014  Site/dose:    1.  T1 target was treated using to Arcs to a prescription dose of 18 Gy. ExacTrac Snap verification was performed for each couch angle.  2.  T12 target was treated using 3 Arcs to a prescription dose of 18 Gy. ExacTrac Snap verification was performed for each couch angle.  3.  L3 target was treated using 3 Arcs to a prescription dose of 18 Gy. ExacTrac Snap verification was performed for each couch angle.    Narrative: The patient tolerated radiation treatment relatively well.   The patient was monitored after his treatment in clinic. He has been given a prescription for a Medrol Dosepak. The patient did well and did not have any difficulties with treatment.  Plan: The patient has completed radiation treatment. The patient will return to radiation oncology clinic for routine followup in one month. I advised the patient to call or return sooner if they have any questions or concerns related to their recovery or treatment. ________________________________  Jodelle Gross, M.D., Ph.D.

## 2014-02-18 NOTE — Op Note (Signed)
   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  I participated closely in the radiation treatment planning afterwards. The patient underwent planning CT myelogram which was fused to the MRI.  These images were fused on the planning system.  Radiation oncology contoured the gross target volume and subsequently expanded this to yield the Planning Target Volume. I actively participated in the planning process.  I helped to define and review the target contours and also the contours of the spinal cord, and selected nearby organs at risk.  All the dose constraints for critical structures were reviewed and compared to AAPM Task Group 101.  The prescription dose conformity was reviewed.  I approved the plan electronically.    Accordingly, David Miles was brought to the TrueBeam stereotactic radiation treatment linac and placed in the custom immobilization device.  The patient was aligned according to the IR fiducial markers with BrainLab Exactrac, then orthogonal x-rays were used in ExacTrac with the 6DOF robotic table and the shifts were made to align the patient.  Then conebeam CT was performed to verify precision.  David Miles received stereotactic radiosurgery uneventfully.  The detailed description of the procedure is recorded in the radiation oncology procedure note.  I was present for the duration of the  procedure.  DISPOSITION:  Following delivery, the patient was transported to nursing in stable condition and monitored for possible acute effects to be discharged to home in stable condition with follow-up in one month.  Peggyann Shoals, MD 02/18/2014 1:20 PM

## 2014-02-18 NOTE — Telephone Encounter (Signed)
[facility] in [place] spoke with Rob,pharmacist, they do have the medrol dose pack there on hand, rx called in for patient ,take as directed, per Dr.John Lisbeth Renshaw, had called patient and let them know not to [facility] to [facility], to go to [facility], also patient given RX for a w/c   3:12 PM

## 2014-02-18 NOTE — Progress Notes (Signed)
Nurse monitoring complete following treatment. Denies nausea, vomiting, headache, dizziness, diplopia or ringing in the ears. Denies pain at this time. Requesting wheelchair script. Informed Dr. Lisbeth Renshaw of these findings.

## 2014-02-18 NOTE — Telephone Encounter (Signed)
error 

## 2014-02-18 NOTE — Telephone Encounter (Signed)
Called [facility] [place] at [phone] spoke with pharmacy tech, they do not have this in stock, will call hometown pharmacy and call wife to advise to go there 3:04 PM

## 2014-02-18 NOTE — Progress Notes (Signed)
  Radiation Oncology         [phone]) [phone] ________________________________  Name: David Miles MRN: [id number]  Date: 02/11/2014  DOB: Oct 06, 1934  SIMULATION AND TREATMENT PLANNING NOTE  DIAGNOSIS:  Metastatic carcinoma with bony metastasis  NARRATIVE:   The patient initially saw Dr. Sondra Come in our department for his diagnosis of metastatic cancer with bony metastasis. The patient was felt to be a good candidate for spine radiosurgery. I have reviewed the patient's case and spoken to him about this possible treatment modality today. In reviewing his PET scan, I recommend radiosurgery to 3 spinal levels: T1, T12, L3. I discussed the benefit of such a treatment as well as the side effects and risks. All of his questions were answered. The patient expressed an interest in proceeding with this treatment.  The patient was then brought to the [facility].  Identity was confirmed.  All relevant records and images related to the planned course of therapy were reviewed.  The patient freely provided informed written consent to proceed with treatment after reviewing the details related to the planned course of therapy. The consent form was witnessed and verified by the simulation staff. Then, the patient was set-up in a stable reproducible supine position for radiation therapy.  A customized VAC lock bag was constructed for patient immobilization in addition to a customized accuform device for stable head positioning. These 2 complex treatment devices will be used during the patient's radiosurgery..  CT images were obtained.  Surface markings were placed.  The CT images were loaded into the planning software and fused with the patient's PET scan.  The patient is on able to obtain an MRI scan due to a pacemaker. Then the target and avoidance structures were contoured.  Treatment planning then occurred.  The radiation prescription was entered and confirmed.  I have requested 3D planning  I have  requested a DVH of the following structures: Planning target volume, kidneys, spinal cord, aorta, esophagus.    PLAN:  The patient will receive 18 Gy in 1 fraction to the 3 target sites: T1 vertebral body, T12 vertebral body, L3 vertebral body.  ________________________________  ------------------------------------------------  Jodelle Gross, MD, PhD

## 2014-02-18 NOTE — Progress Notes (Signed)
  Radiation Oncology         (336) [phone] ________________________________  Name: David Miles MRN: [id number]  Date: 02/18/2014  DOB: January 05, 1934   SPECIAL TREATMENT PROCEDURE   3D TREATMENT PLANNING AND DOSIMETRY: The patient's radiation plan was reviewed and approved by Dr. Vertell Limber from neurosurgery and radiation oncology prior to treatment. It showed 3-dimensional radiation distributions overlaid onto the planning CT/MRI image set. The [facility] for the target structures as well as the organs at risk were reviewed. The documentation of the 3D plan and dosimetry are filed in the radiation oncology EMR.   NARRATIVE: The patient was brought to the TrueBeam stereotactic radiation treatment machine and placed supine on the CT couch. Patient was positioned using the customized VAC lock bag and customized accuform device, and the patient was set up for stereotactic radiosurgery to the spine. Neurosurgery was present for the set-up and delivery   SIMULATION VERIFICATION: In the couch zero-angle position, the patient underwent Exactrac imaging using the Brainlab system with orthogonal KV images. These were carefully aligned and repeated to confirm treatment position for each of the isocenters. The Exactrac snap film verification was repeated at each couch angle.   SPECIAL TREATMENT PROCEDURE: The patient received stereotactic radiosurgery to the following targets:  1.  T1 target was treated using to Arcs to a prescription dose of 18 Gy. ExacTrac Snap verification was performed for each couch angle.  2.  T12 target was treated using 3 Arcs to a prescription dose of 18 Gy. ExacTrac Snap verification was performed for each couch angle.  3.  L3 target was treated using 3 Arcs to a prescription dose of 18 Gy. ExacTrac Snap verification was performed for each couch angle.   STEREOTACTIC TREATMENT MANAGEMENT: Following delivery, the patient was transported to nursing in stable condition and monitored for  possible acute effects. Vital signs were recorded . The patient tolerated treatment without significant acute effects, and was discharged to home in stable condition.  PLAN: Follow-up in one month.   ------------------------------------------------  Jodelle Gross, MD, PhD

## 2014-02-22 ENCOUNTER — Telehealth: Payer: Self-pay | Admitting: Dietician

## 2014-02-22 NOTE — Telephone Encounter (Signed)
Brief Outpatient Oncology Nutrition Note  Patient has been identified to be at risk on malnutrition screen.  Wt Readings from Last 10 Encounters:  02/14/14 214 lb (97.07 kg)  02/09/14 215 lb 12.8 oz (97.886 kg)  02/08/14 215 lb 12.8 oz (97.886 kg)  01/28/14 218 lb (98.884 kg)  01/24/14 225 lb 1.6 oz (102.105 kg)  01/03/14 229 lb 12.8 oz (104.237 kg)    Patient with Stage IV carcinoma with involvement of the liver, spine and diffuse lymphadenopathy with no clear- cut primary identified and the pathology suggest a genitourinary etiology vs unknown primary.  Patient for probably palliative chemo.  Complaints of constipation noted.  Called and spoke with patient's wife.  Patient with decreased appetite and poor intake of meals.  Wife is preparing him a high calorie shake daily.  Discussed importance of maintaining adequate intake to maintain strength and mobility and help extend quality of life.  Discussed importance of 3 meals plus supplements daily and increasing shakes to 3 daily if patient is not eating well.  Discussed tips to increase calories and protein.  Wife states that because of weight loss, dentures do not fit well and this is also making it more difficult to eat.  Will mail nutrition handouts on tips to increase appetite, and increase calories and protein along with coupons for [employer] and [employer], Recipe book for shakes using [employer] and [place] [address] contact information.  David Miles, RD, LDN

## 2014-02-26 NOTE — Consult Note (Signed)
I have reviewed and discussed the care of this patient in detail with the nurse practitioner including pertinent patient records, physical exam findings and data. I agree with details of this encounter.  

## 2014-03-02 ENCOUNTER — Ambulatory Visit (HOSPITAL_BASED_OUTPATIENT_CLINIC_OR_DEPARTMENT_OTHER): Payer: Medicare Other

## 2014-03-02 ENCOUNTER — Ambulatory Visit (HOSPITAL_BASED_OUTPATIENT_CLINIC_OR_DEPARTMENT_OTHER): Payer: Medicare Other | Admitting: Oncology

## 2014-03-02 ENCOUNTER — Other Ambulatory Visit: Payer: Self-pay | Admitting: Medical Oncology

## 2014-03-02 ENCOUNTER — Encounter: Payer: Self-pay | Admitting: Oncology

## 2014-03-02 ENCOUNTER — Other Ambulatory Visit (HOSPITAL_BASED_OUTPATIENT_CLINIC_OR_DEPARTMENT_OTHER): Payer: Medicare Other

## 2014-03-02 VITALS — BP 122/71 | HR 82 | Temp 97.6°F | Resp 18 | Ht 72.0 in | Wt 206.7 lb

## 2014-03-02 DIAGNOSIS — C801 Malignant (primary) neoplasm, unspecified: Secondary | ICD-10-CM

## 2014-03-02 DIAGNOSIS — R599 Enlarged lymph nodes, unspecified: Secondary | ICD-10-CM

## 2014-03-02 DIAGNOSIS — C787 Secondary malignant neoplasm of liver and intrahepatic bile duct: Secondary | ICD-10-CM

## 2014-03-02 DIAGNOSIS — C419 Malignant neoplasm of bone and articular cartilage, unspecified: Secondary | ICD-10-CM

## 2014-03-02 DIAGNOSIS — C7952 Secondary malignant neoplasm of bone marrow: Secondary | ICD-10-CM

## 2014-03-02 DIAGNOSIS — C7951 Secondary malignant neoplasm of bone: Secondary | ICD-10-CM

## 2014-03-02 LAB — CBC WITH DIFFERENTIAL/PLATELET
BASO%: 0.3 % (ref 0.0–2.0)
Basophils Absolute: 0.1 10*3/uL (ref 0.0–0.1)
EOS%: 1.1 % (ref 0.0–7.0)
Eosinophils Absolute: 0.2 10*3/uL (ref 0.0–0.5)
HCT: 32.9 % — ABNORMAL LOW (ref 38.4–49.9)
HGB: 10.6 g/dL — ABNORMAL LOW (ref 13.0–17.1)
LYMPH%: 6.4 % — ABNORMAL LOW (ref 14.0–49.0)
MCH: 27.5 pg (ref 27.2–33.4)
MCHC: 32.3 g/dL (ref 32.0–36.0)
MCV: 85.1 fL (ref 79.3–98.0)
MONO#: 1.3 10*3/uL — ABNORMAL HIGH (ref 0.1–0.9)
MONO%: 8.7 % (ref 0.0–14.0)
NEUT%: 83.5 % — ABNORMAL HIGH (ref 39.0–75.0)
NEUTROS ABS: 12.5 10*3/uL — AB (ref 1.5–6.5)
Platelets: 165 10*3/uL (ref 140–400)
RBC: 3.86 10*6/uL — AB (ref 4.20–5.82)
RDW: 15.1 % — AB (ref 11.0–14.6)
WBC: 14.9 10*3/uL — ABNORMAL HIGH (ref 4.0–10.3)
lymph#: 1 10*3/uL (ref 0.9–3.3)

## 2014-03-02 LAB — COMPREHENSIVE METABOLIC PANEL (CC13)
ALBUMIN: 2.8 g/dL — AB (ref 3.5–5.0)
ALK PHOS: 103 U/L (ref 40–150)
ALT: 23 U/L (ref 0–55)
AST: 25 U/L (ref 5–34)
Anion Gap: 7 mEq/L (ref 3–11)
BUN: 35.5 mg/dL — ABNORMAL HIGH (ref 7.0–26.0)
CO2: 27 mEq/L (ref 22–29)
Calcium: 15.5 mg/dL (ref 8.4–10.4)
Chloride: 105 mEq/L (ref 98–109)
Creatinine: 1.8 mg/dL — ABNORMAL HIGH (ref 0.7–1.3)
Glucose: 103 mg/dl (ref 70–140)
POTASSIUM: 5.1 meq/L (ref 3.5–5.1)
Sodium: 138 mEq/L (ref 136–145)
Total Bilirubin: 0.6 mg/dL (ref 0.20–1.20)
Total Protein: 7.4 g/dL (ref 6.4–8.3)

## 2014-03-02 MED ORDER — SODIUM CHLORIDE 0.9 % IV SOLN
Freq: Once | INTRAVENOUS | Status: AC
  Administered 2014-03-02: 10:00:00 via INTRAVENOUS

## 2014-03-02 MED ORDER — ZOLEDRONIC ACID 4 MG/100ML IV SOLN
4.0000 mg | Freq: Once | INTRAVENOUS | Status: AC
  Administered 2014-03-02: 4 mg via INTRAVENOUS
  Filled 2014-03-02: qty 100

## 2014-03-02 NOTE — Patient Instructions (Signed)
Dehydration, Adult Dehydration is when you lose more fluids from the body than you take in. Vital organs like the kidneys, brain, and heart cannot function without a proper amount of fluids and salt. Any loss of fluids from the body can cause dehydration.  CAUSES  Not taking in enough fluids or the following:    Vomiting.  Diarrhea.  Excessive sweating.  Excessive urine output.  Fever. SYMPTOMS  Mild dehydration  Thirst.  Dry lips.  Slightly dry mouth. Moderate dehydration  Very dry mouth.  Sunken eyes.  Skin does not bounce back quickly when lightly pinched and released.  Dark urine and decreased urine production.  Decreased tear production.  Headache. Severe dehydration  Very dry mouth.  Extreme thirst.  Rapid, weak pulse (more than 100 beats per minute at rest).  Cold hands and feet.  Not able to sweat in spite of heat and temperature.  Rapid breathing.  Blue lips.  Confusion and lethargy.  Difficulty being awakened.  Minimal urine production.  No tears. DIAGNOSIS  Your caregiver will diagnose dehydration based on your symptoms and your exam. Blood and urine tests will help confirm the diagnosis. The diagnostic evaluation should also identify the cause of dehydration. TREATMENT  Treatment of mild or moderate dehydration can often be done at home by increasing the amount of fluids that you drink. It is best to drink small amounts of fluid more often. Drinking too much at one time can make vomiting worse. Refer to the home care instructions below. Severe dehydration needs to be treated at the hospital where you will probably be given intravenous (IV) fluids that contain water and electrolytes. HOME CARE INSTRUCTIONS   Ask your caregiver about specific rehydration instructions.  Drink enough fluids to keep your urine clear or pale yellow.  Drink small amounts frequently if you have nausea and vomiting.  Eat as you normally do.  Avoid:  Foods  or drinks high in sugar.  Carbonated drinks.  Juice.  Extremely hot or cold fluids.  Drinks with caffeine.  Fatty, greasy foods.  Alcohol.  Tobacco.  Overeating.  Gelatin desserts.  Wash your hands well to avoid spreading bacteria and viruses.  Only take over-the-counter or prescription medicines for pain, discomfort, or fever as directed by your caregiver.  Ask your caregiver if you should continue all prescribed and over-the-counter medicines.  Keep all follow-up appointments with your caregiver. SEEK MEDICAL CARE IF:  You have abdominal pain and it increases or stays in one area (localizes).  You have a rash, stiff neck, or severe headache.  You are irritable, sleepy, or difficult to awaken.  You are weak, dizzy, or extremely thirsty. SEEK IMMEDIATE MEDICAL CARE IF:   You are unable to keep fluids down or you get worse despite treatment.  You have frequent episodes of vomiting or diarrhea.  You have blood or green matter (bile) in your vomit.  You have blood in your stool or your stool looks black and tarry.  You have not urinated in 6 to 8 hours, or you have only urinated a small amount of very dark urine.  You have a fever.  You faint. MAKE SURE YOU:   Understand these instructions.  Will watch your condition.  Will get help right away if you are not doing well or get worse. Document Released: 12/16/2005 Document Revised: 03/09/2012 Document Reviewed: 08/05/2011 [facility] Patient Information 2014 [place], [place].

## 2014-03-02 NOTE — Progress Notes (Signed)
Hematology and Oncology Follow Up Visit  David Miles [id number] December 15, 1934 80 y.o. 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 He had liver biopsy to confirm the presence of carcinoma etiology likely genitourinary versus squamous cell carcinoma.   Prior Therapy: He is status post liver biopsy on 01/28/2014. He is status post SRS treatment to the T1, T12, and L3 spine completed in 02/18/2014.  Current therapy: He is status post Zometa in January of 2015 to be repeated as needed for the hypercalcemia.  Interim History:  David Miles presents today for a followup visit. He is a pleasant gentleman with liver metastasis of unknown primary. Since his last visit, he ablated radiation therapy to his spine which have helped with his symptoms. Clinically he has been doing rather poorly. His reporting more constipation. He is not reporting any neurological symptoms. His lower and mid back pain responded to long-acting OxyContin pain medications at this time. He prefers hydrocodone better for breakthrough medication. He has not reported any nausea or vomiting. Did not report any abdominal pain or discomfort. Has not reported any chest pain shortness of breath cough or hemoptysis. Has not reported any headaches blurred vision double vision or change in his neurological symptoms. His performance status is declining and requiring more more assistance in his activity of daily living. Wife is struggling to take care of him at this time and he spending more time in a chair and bed continuously.   Medications: I have reviewed the patient's current medications.  Current Outpatient Prescriptions  Medication Sig Dispense Refill  . acetaminophen-codeine (TYLENOL #3) 300-30 MG per tablet Take 1 tablet by mouth every 4 (four)  hours as needed for moderate pain.      Marland Kitchen aspirin 81 MG tablet Take 81 mg by mouth every evening.       Marland Kitchen atorvastatin (LIPITOR) 20 MG tablet Take 20 mg by mouth every other day.      . B Complex-C (B-COMPLEX WITH VITAMIN C) tablet Take 1 tablet by mouth daily.      Marland Kitchen HYDROcodone-acetaminophen (NORCO) 10-325 MG per tablet Take 1 tablet by mouth every 6 (six) hours as needed.  60 tablet  0  . KRILL OIL PO Take 1 capsule by mouth daily.      Marland Kitchen lisinopril (PRINIVIL,ZESTRIL) 40 MG tablet Take 20 mg by mouth 2 (two) times daily.       . metoprolol succinate (TOPROL-XL) 100 MG 24 hr tablet Take 50 mg by mouth 2 (two) times daily. Take 1/2 am  1/2 pm      . oxyCODONE (OXY IR/ROXICODONE) 5 MG immediate release tablet Take 1 tablet (5 mg total) by mouth every 4 (four) hours as needed for severe pain.  60 tablet  0  . OxyCODONE (OXYCONTIN) 15 mg T12A 12 hr tablet Take 1 tablet (15 mg total) by mouth every 12 (twelve) hours.  60 tablet  0   No current facility-administered medications for this visit.     Allergies: No Known Allergies  Past Medical History, Surgical history, Social history, and Family History were reviewed and updated.  Review of Systems: Constitutional:  Negative for fever, chills, night sweats, anorexia, weight loss, pain. Cardiovascular: no chest pain or dyspnea on exertion Respiratory: no cough, shortness of breath, or wheezing Neurological: no TIA or stroke symptoms Dermatological: negative for pruritus and  rash ENT: negative for - epistaxis or sore throat Skin: Negative. Gastrointestinal: negative for - abdominal pain, or diarrhea. Reporting constipation Genito-Urinary: negative for - hematuria Hematological and Lymphatic: negative for - bruising, fatigue or jaundice Musculoskeletal: positive for - Back pain.  Remaining ROS negative. Physical Exam: Blood pressure 122/71, pulse 82, temperature 97.6 F (36.4 C), temperature source Oral, resp. rate 18, height 6' (1.829 m),  weight 206 lb 11.2 oz (93.759 kg), SpO2 100.00%. ECOG: 3 General appearance: Lethargic slightly confused appeared in mild distress. Head: Normocephalic, without obvious abnormality, atraumatic Neck: no adenopathy, no carotid bruit, no JVD, supple, symmetrical, trachea midline and thyroid not enlarged, symmetric, no tenderness/mass/nodules Lymph nodes: Cervical, supraclavicular, and axillary nodes normal. Heart:regular rate and rhythm, S1, S2 normal, no murmur, click, rub or gallop Lung:chest clear, no wheezing, rales, normal symmetric air entry Abdomin: soft, non-tender, without masses or organomegaly EXT:no erythema, induration, or nodules Neurological exam: No deficits noted with his motor sensory or and deep tendon reflexes.  Lab Results: Lab Results  Component Value Date   WBC 14.9* 03/02/2014   HGB 10.6* 03/02/2014   HCT 32.9* 03/02/2014   MCV 85.1 03/02/2014   PLT 165 03/02/2014     Chemistry      Component Value Date/Time   NA 141 01/24/2014 1359   NA 139 08/03/2011 1721   K 4.7 01/24/2014 1359   K 4.9 08/03/2011 1721   CL 108 08/03/2011 1721   CO2 29 01/24/2014 1359   CO2 32 12/07/2007 1025   BUN 45.4* 01/24/2014 1359   BUN 30* 08/03/2011 1721   CREATININE 2.5* 01/24/2014 1359   CREATININE 1.70* 08/03/2011 1721      Component Value Date/Time   CALCIUM 13.7* 01/24/2014 1359   CALCIUM 9.7 12/07/2007 1025   ALKPHOS 105 01/24/2014 1359   ALKPHOS 71 07/29/2007 1132   AST 20 01/24/2014 1359   AST 25 07/29/2007 1132   ALT 18 01/24/2014 1359   ALT 20 07/29/2007 1132   BILITOT 0.50 01/24/2014 1359   BILITOT 0.6 07/29/2007 1132     Impression and Plan:   80 year old gentleman with the following issues:  1. 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 There is no clear-cut primary identified and the pathology suggest a genitourinary etiology versus unknown primary. His clinical status have deteriorated rapidly and he has declined systemic chemotherapy. I feel  this is the right step for him and I feel we need to proceed with supportive care only. I think the benefit of chemotherapy with him will be rather marginal. I discussed with him hospice role and he is agreeable to enroll at this time I feel that his disease is rather aggressive in his prognosis is very poor probably less than 6 months.  2. T12 bony involvement. Status post radiation therapy with improvement in his pain.  3. Pain: Under reasonable control with OxyContin and hydrocodone.  4. Hypercalcemia: He received a Zometa in the past I will repeat it today as he appears to be slightly confused as well as mildly dehydrated. I will give him IV fluids as well in an attempt to palliate some of the symptoms.  5. Followup: Will be in one month's time if he is able to make it otherwise he'll be under hospice care.  [facility], MD 3/4/20159:36 AM

## 2014-03-02 NOTE — Progress Notes (Signed)
Wife provided copy of Living Will. Sent to HIM for scanning to be placed in patient chart.  Per MD, Hospice referral made to [facility]. Dr Alen Blew to be attending and hospice MD's to do symptom management.

## 2014-03-03 ENCOUNTER — Telehealth: Payer: Self-pay | Admitting: Oncology

## 2014-03-03 ENCOUNTER — Telehealth: Payer: Self-pay | Admitting: Medical Oncology

## 2014-03-03 ENCOUNTER — Telehealth: Payer: Self-pay | Admitting: *Deleted

## 2014-03-03 NOTE — Telephone Encounter (Signed)
pt now in hospice and aware of appt.Marland KitchenMarland KitchenMarland Kitchen

## 2014-03-03 NOTE — Telephone Encounter (Signed)
PT. AND FAMILY IN THE PROCESS OF HAVING PT.'S DEFIBRILLATOR DEACTIVATED. THIS NOTE TO DR.SHADAD'S ACTIVE WORK FOLDER.

## 2014-03-03 NOTE — Telephone Encounter (Signed)
Per Dr Alen Blew, V.O given to [facility] for DNR and OK for hospice MD to sign,  as requested.

## 2014-03-07 ENCOUNTER — Telehealth: Payer: Self-pay | Admitting: *Deleted

## 2014-03-07 NOTE — Telephone Encounter (Signed)
Hospice nurse debbie [phone]) calling to report patient was severly impacted over the week-end, is on 2-senna and 2 dulcolax daily. patient's son, who is a paramedic, disimpacted him Saturday night, but feels like he needs something else. Dr Alen Blew out of the office today. referred her to dr hertweck for symptom management. Patient also having increased weakness and fatigue and has difficulty getting to the BR. Per dr Tomasa Hosteller, they will insert a foley catheter. Note to dr Hazeline Junker desk.

## 2014-03-17 ENCOUNTER — Telehealth: Payer: Self-pay

## 2014-03-17 ENCOUNTER — Telehealth: Payer: Self-pay | Admitting: Medical Oncology

## 2014-03-23 ENCOUNTER — Ambulatory Visit: Payer: Medicare Other | Admitting: Radiation Oncology

## 2014-03-30 ENCOUNTER — Ambulatory Visit: Payer: Medicare Other | Admitting: Physician Assistant

## 2014-03-30 ENCOUNTER — Other Ambulatory Visit: Payer: Medicare Other

## 2014-03-30 NOTE — Telephone Encounter (Signed)
VMOMJackelyn Miles from [facility] called to inform office that patient has passed away today @ 3:11 pm. Dr. Alen Blew informed.  Future appts cancelled for lab and Awilda Metro, [place]

## 2014-03-30 NOTE — Telephone Encounter (Signed)
Received call from Jensen in med/onc that patient is deceased.Informed Rhonda to discontinue appointment for 03/23/14 for Dr.Moody.

## 2014-03-30 DEATH — deceased

## 2014-07-16 IMAGING — CT NM PET TUM IMG INITIAL (PI) SKULL BASE T - THIGH
6 series · 25 of 25 positions shown · non-contrast
Comparison: US BIOPSY dated 01/28/2014; CT UROGRAM dated 01/14/2014

CLINICAL DATA: Initial treatment strategy for liver carcinoma.

EXAM:
NUCLEAR MEDICINE PET SKULL BASE TO THIGH
FASTING BLOOD GLUCOSE:  Value: 99 mg/dl
TECHNIQUE: 12.3 mCi F-18 FDG was injected intravenously. CT data was obtained
and used for attenuation correction and anatomic localization only.
(This was not acquired as a diagnostic CT examination.) Additional
exam technical data entered on technologist worksheet.

[Series 3: pet sk_thigh ac · axial · 5.0mm · 4.07mm/px · z∈[-1002,-122]mm · 5 of 221 slices shown]
[im 1/221]
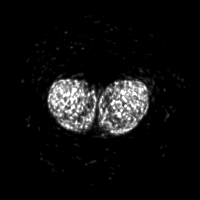
[im 56/221]
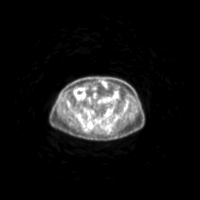
[im 111/221]
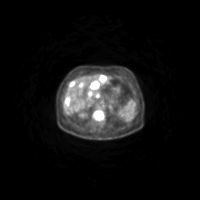
[im 166/221]
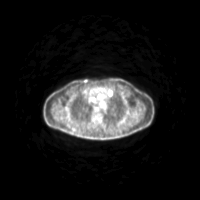
[im 221/221]
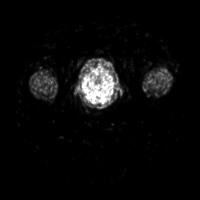

[Series 4: ct sk_thigh 5.0 hd_fov · axial · 5.0mm · 1.07mm/px · z∈[-1002,-122]mm · 6 of 221 slices shown]
[im 1/221]
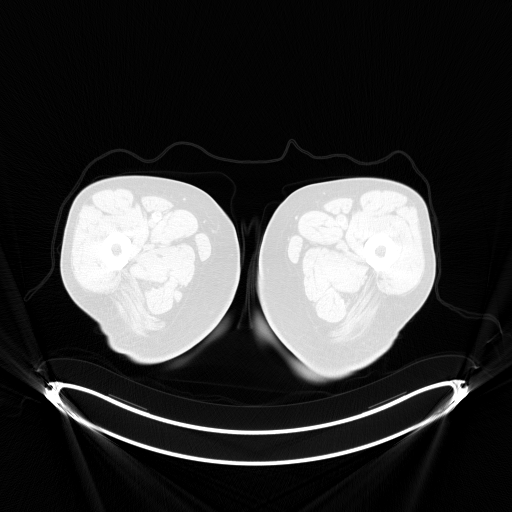
[im 45/221]
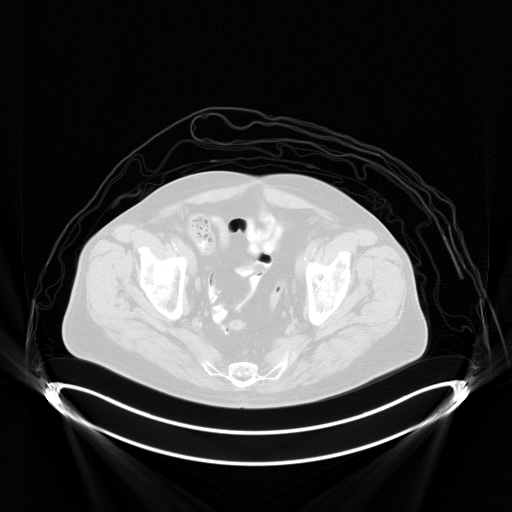
[im 89/221]
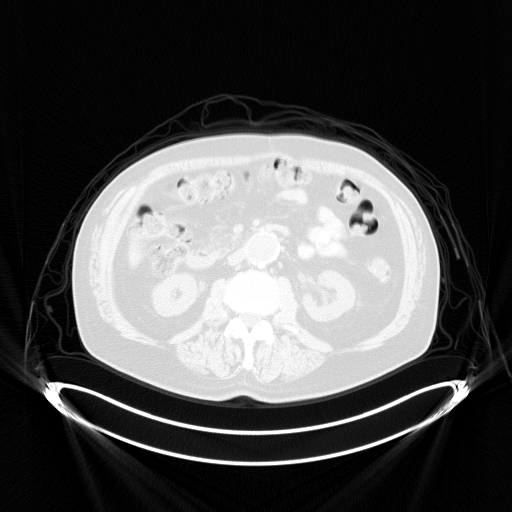
[im 133/221]
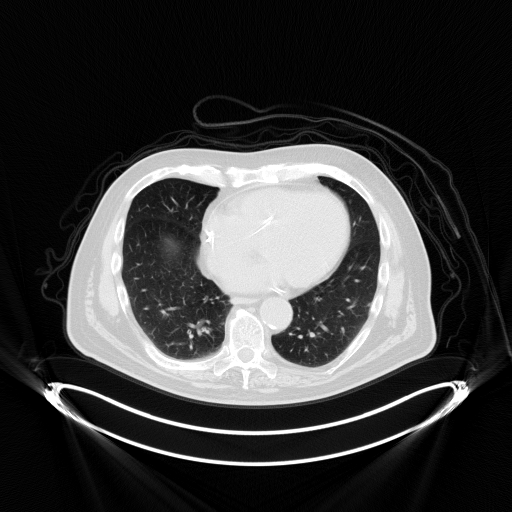
[im 177/221]
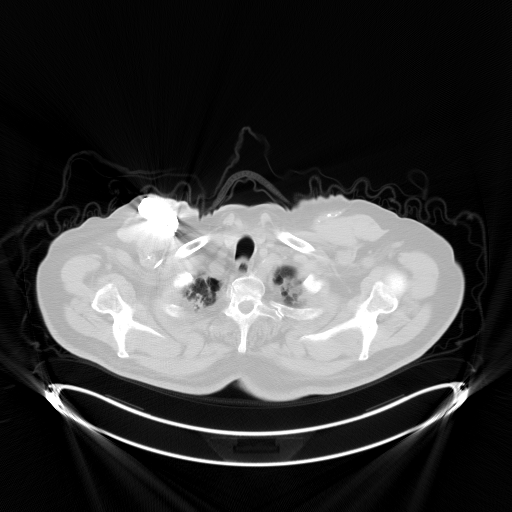
[im 221/221  brain]
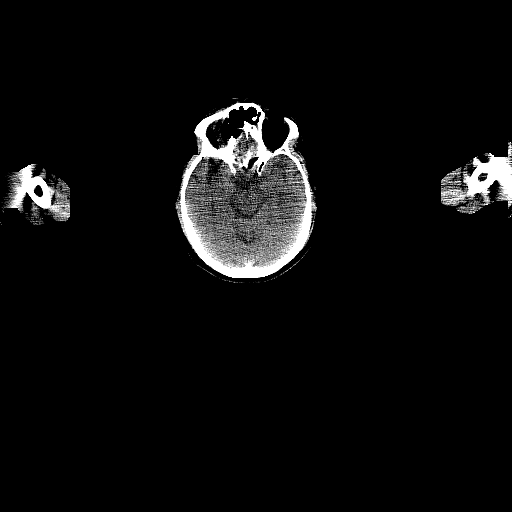

[Series 6: pet sk_thigh nac · axial · 5.0mm · 4.07mm/px · z∈[-1002,-122]mm · 6 of 221 slices shown]
[im 1/221]
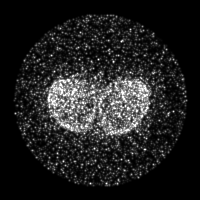
[im 45/221]
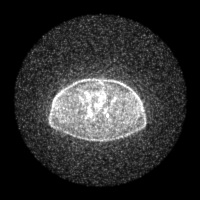
[im 89/221]
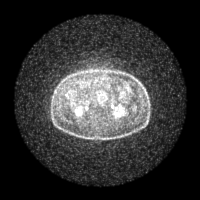
[im 133/221]
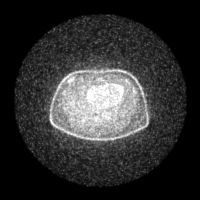
[im 177/221]
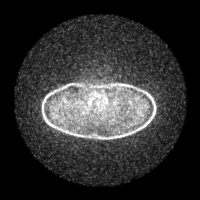
[im 221/221]
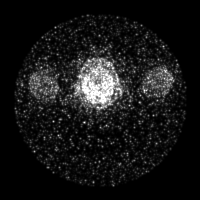

[Series 605: range-ct sk_thigh 5.0 hd_fov-cor-<alpha range> · 2 of 95 slices shown]
[im 1/95]
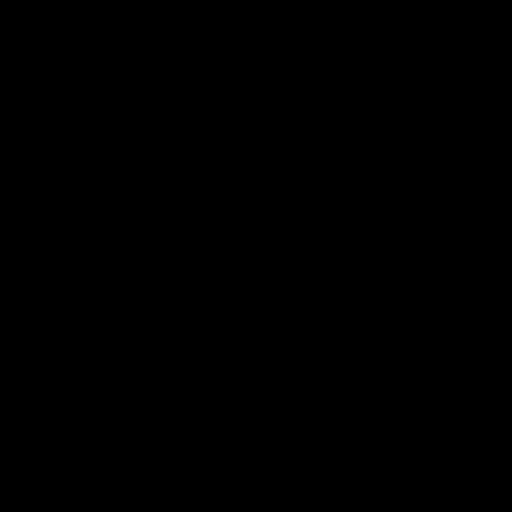
[im 95/95]
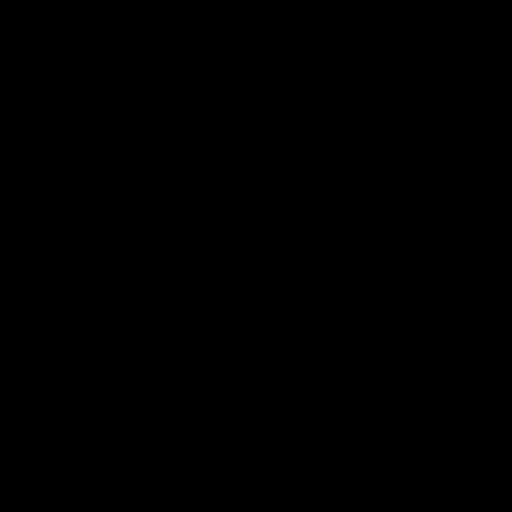

[Series 606: range-ct sk_thigh 5.0 hd_fov-tra-<alpha range> · 5 of 213 slices shown]
[im 1/213]
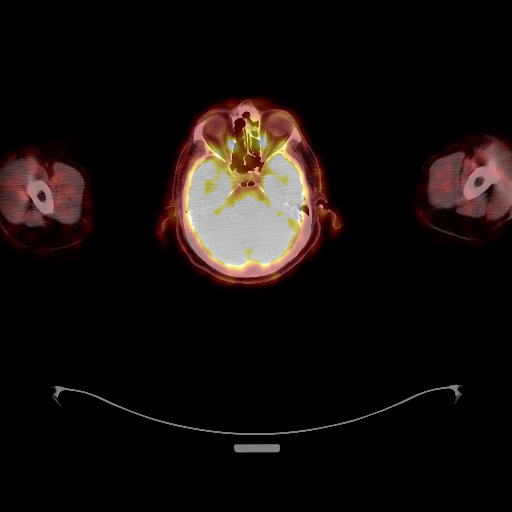
[im 54/213]
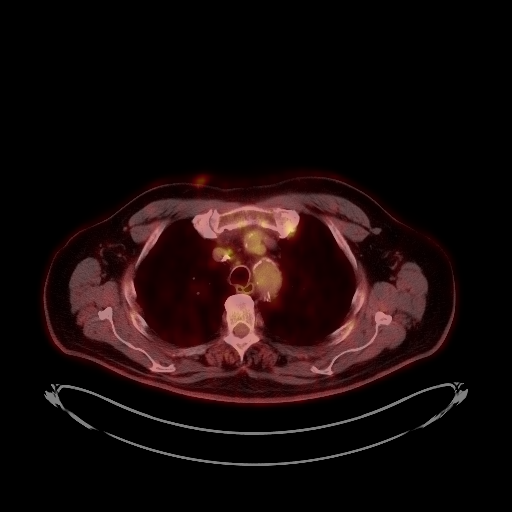
[im 107/213]
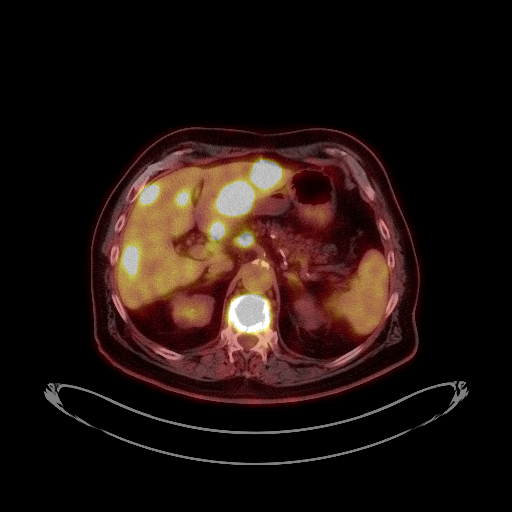
[im 160/213]
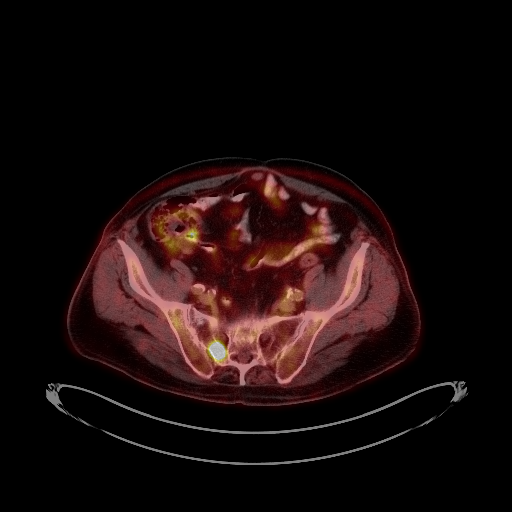
[im 213/213]
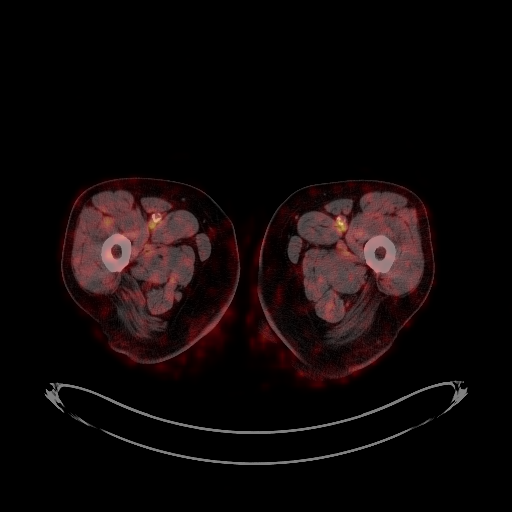

[Series 1034: results mm oncology reading · 1.57mm/px · 1 of 19 slices shown]
[im 1/19]
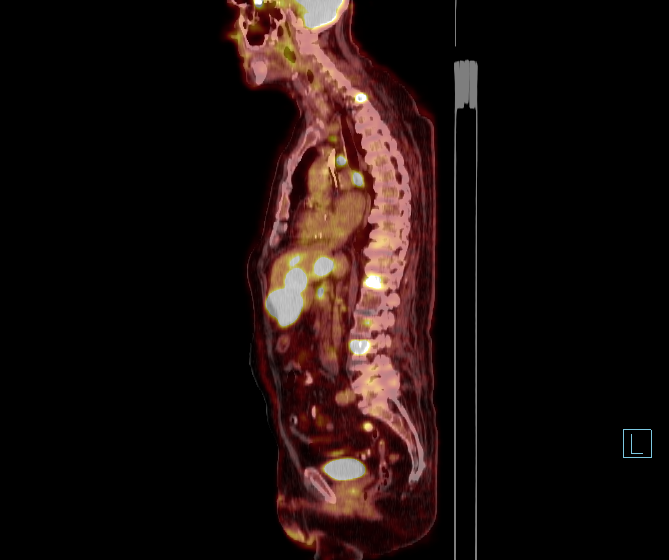

[25 of 25 positions shown; findings below may reference images not displayed]

FINDINGS: NECK

Hypermetabolic left supraclavicular lymph nodes. Hypermetabolic
small left level IIa lymph node.

CHEST

Extensive hypermetabolic paratracheal and right perihilar
adenopathy. Example right hilar nodes with SUV max 21.9. Equally
hypermetabolic subcarinal nodes in lower paratracheal nodes. No
hypermetabolic pulmonary nodules.

ABDOMEN/PELVIS

There is widespread intense hypermetabolic hepatic metastasis. The
inferior left hepatic lobe (segment 3) is near completely replaced
by tumor SUV max 30.9. There are multiple round metastasis with the
right hepatic lobe with SUV max 30.0. Approximately 20 discrete
metastasis within the liver.

There is a hypermetabolic small periportal lymph nodes

No abnormal metabolic activity associated with the pancreas. No
focal metabolic activity colon are prostate gland.

SKELETON

Widespread skeletal metastasis. Again demonstrated lytic lesion at
T12 with intense metabolic activity (SUV max 25.5). Additional
skeletal metastasis include the TT vertebral body at the level right
facet. Medial first rib on the left costal chondral vertebral
junction (image 54). Bilateral rib lesions.

There is a focus of uptake within the right sacral ala with SUV max
19.5. Additional focus of metastasis within the left iliac bone.
IMPRESSION: 1. Widespread hypermetabolic metastasis involving the liver,
skeleton, mediastinal lymph nodes, and supraclavicular lymph nodes.
2. No localization of primary carcinoma.

## 2014-08-23 ENCOUNTER — Other Ambulatory Visit: Payer: Self-pay | Admitting: *Deleted
